# Patient Record
Sex: Female | Born: 1985 | Race: White | Hispanic: No | Marital: Single | Smoking: Former smoker
Health system: Southern US, Community
[De-identification: ages and names within clinical notes are randomized; demographics above are authoritative.]

## PROBLEM LIST (undated history)

## (undated) ENCOUNTER — Inpatient Hospital Stay (HOSPITAL_COMMUNITY): Payer: Self-pay

## (undated) DIAGNOSIS — F329 Major depressive disorder, single episode, unspecified: Secondary | ICD-10-CM

## (undated) DIAGNOSIS — F4310 Post-traumatic stress disorder, unspecified: Secondary | ICD-10-CM

## (undated) DIAGNOSIS — R87619 Unspecified abnormal cytological findings in specimens from cervix uteri: Secondary | ICD-10-CM

## (undated) DIAGNOSIS — F209 Schizophrenia, unspecified: Secondary | ICD-10-CM

## (undated) DIAGNOSIS — J45909 Unspecified asthma, uncomplicated: Secondary | ICD-10-CM

## (undated) DIAGNOSIS — R519 Headache, unspecified: Secondary | ICD-10-CM

## (undated) DIAGNOSIS — F909 Attention-deficit hyperactivity disorder, unspecified type: Secondary | ICD-10-CM

## (undated) DIAGNOSIS — IMO0002 Reserved for concepts with insufficient information to code with codable children: Secondary | ICD-10-CM

## (undated) DIAGNOSIS — F32A Depression, unspecified: Secondary | ICD-10-CM

## (undated) DIAGNOSIS — M26609 Unspecified temporomandibular joint disorder, unspecified side: Secondary | ICD-10-CM

## (undated) DIAGNOSIS — B009 Herpesviral infection, unspecified: Secondary | ICD-10-CM

## (undated) DIAGNOSIS — R51 Headache: Secondary | ICD-10-CM

## (undated) DIAGNOSIS — F319 Bipolar disorder, unspecified: Secondary | ICD-10-CM

## (undated) DIAGNOSIS — M1990 Unspecified osteoarthritis, unspecified site: Secondary | ICD-10-CM

## (undated) DIAGNOSIS — K219 Gastro-esophageal reflux disease without esophagitis: Secondary | ICD-10-CM

## (undated) HISTORY — PX: WISDOM TOOTH EXTRACTION: SHX21

## (undated) HISTORY — PX: COLPOSCOPY W/ BIOPSY / CURETTAGE: SUR283

---
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
## 2008-08-18 ENCOUNTER — Emergency Department: Payer: Self-pay | Admitting: Emergency Medicine

## 2010-12-23 ENCOUNTER — Emergency Department (HOSPITAL_COMMUNITY): Payer: Self-pay

## 2010-12-23 ENCOUNTER — Emergency Department (HOSPITAL_COMMUNITY)
Admission: EM | Admit: 2010-12-23 | Discharge: 2010-12-23 | Disposition: A | Discharge status: Home / Self Care | Payer: Self-pay | Attending: Emergency Medicine | Admitting: Emergency Medicine

## 2010-12-23 DIAGNOSIS — M25539 Pain in unspecified wrist: Secondary | ICD-10-CM | POA: Insufficient documentation

## 2010-12-23 DIAGNOSIS — S63509A Unspecified sprain of unspecified wrist, initial encounter: Secondary | ICD-10-CM | POA: Insufficient documentation

## 2011-09-10 ENCOUNTER — Encounter: Payer: Self-pay | Admitting: Physical Medicine and Rehabilitation

## 2011-09-10 ENCOUNTER — Emergency Department (HOSPITAL_COMMUNITY)
Admission: EM | Admit: 2011-09-10 | Discharge: 2011-09-10 | Disposition: A | Discharge status: Home / Self Care | Payer: Self-pay | Attending: Emergency Medicine | Admitting: Emergency Medicine

## 2011-09-10 DIAGNOSIS — R109 Unspecified abdominal pain: Secondary | ICD-10-CM | POA: Insufficient documentation

## 2011-09-10 DIAGNOSIS — Z3490 Encounter for supervision of normal pregnancy, unspecified, unspecified trimester: Secondary | ICD-10-CM

## 2011-09-10 DIAGNOSIS — O219 Vomiting of pregnancy, unspecified: Secondary | ICD-10-CM | POA: Insufficient documentation

## 2011-09-10 DIAGNOSIS — F17200 Nicotine dependence, unspecified, uncomplicated: Secondary | ICD-10-CM | POA: Insufficient documentation

## 2011-09-10 LAB — URINALYSIS, ROUTINE W REFLEX MICROSCOPIC
Bilirubin Urine: NEGATIVE
Glucose, UA: NEGATIVE mg/dL
Hgb urine dipstick: NEGATIVE
Ketones, ur: 15 mg/dL — AB
Leukocytes, UA: NEGATIVE
Protein, ur: NEGATIVE mg/dL
pH: 7.5 (ref 5.0–8.0)

## 2011-09-10 MED ORDER — ONDANSETRON 4 MG PO TBDP
4.0000 mg | ORAL_TABLET | Freq: Once | ORAL | Status: AC
  Administered 2011-09-10: 4 mg via ORAL
  Filled 2011-09-10: qty 1

## 2011-09-10 MED ORDER — ONDANSETRON 8 MG PO TBDP: 8.0000 mg | ORAL_TABLET | Freq: Three times a day (TID) | ORAL | Status: AC | PRN

## 2011-09-10 MED ORDER — PRENATAL RX 60-1 MG PO TABS: 1.0000 | ORAL_TABLET | Freq: Every day | ORAL | Status: DC

## 2011-09-10 NOTE — ED Provider Notes (Signed)
History     CSN: [id number] Arrival date & time: 09/10/2011  4:22 PM   First MD Initiated Contact with Patient 09/10/11 1923      Chief Complaint  Patient presents with  . Abdominal Pain    (Consider location/radiation/quality/duration/timing/severity/associated sxs/prior treatment) Patient is a 25 y.o. female presenting with abdominal pain. The history is provided by the patient.  Abdominal Pain The primary symptoms of the illness include abdominal pain, nausea and vomiting. The primary symptoms of the illness do not include shortness of breath, diarrhea, vaginal discharge or vaginal bleeding.  Symptoms associated with the illness do not include back pain.   patient has had some abdominal pain for last 3 weeks. She's also had nausea vomiting. Is worse lower abdomen. She states she may be urinating a little more frequently. No fevers. No diarrhea. She states her last period was the end of last month but was lighter than normal. He states it is possible she could be pregnant. No vaginal discharge. No lightheadedness or dizziness. No sick contacts.  No past medical history on file.  No past surgical history on file.  No family history on file.  History  Substance Use Topics  . Smoking status: Current Everyday Smoker  . Smokeless tobacco: Not on file  . Alcohol Use:     OB History    Grav Para Term Preterm Abortions TAB SAB Ect Mult Living                  Review of Systems  Constitutional: Negative for activity change and appetite change.  HENT: Negative for neck stiffness.   Eyes: Negative for pain.  Respiratory: Negative for chest tightness and shortness of breath.   Cardiovascular: Negative for chest pain and leg swelling.  Gastrointestinal: Positive for nausea, vomiting and abdominal pain. Negative for diarrhea.  Genitourinary: Negative for flank pain, vaginal bleeding and vaginal discharge.  Musculoskeletal: Negative for back pain.  Skin: Negative for rash.    Neurological: Negative for weakness, numbness and headaches.  Psychiatric/Behavioral: Negative for behavioral problems.    Allergies  Review of patient's allergies indicates no known allergies.  Home Medications   Current Outpatient Rx  Name Route Sig Dispense Refill  . VALACYCLOVIR HCL 500 MG PO TABS Oral Take 500 mg by mouth every morning.      Marland Kitchen ONDANSETRON 8 MG PO TBDP Oral Take 1 tablet (8 mg total) by mouth every 8 (eight) hours as needed for nausea. 20 tablet 0  . PRENATAL RX 60-1 MG PO TABS Oral Take 1 tablet by mouth daily. 30 tablet 1    BP 101/58  Pulse 81  Temp(Src) 97.8 F (36.6 C) (Oral)  Resp 16  SpO2 100%  LMP 08/26/2011  Physical Exam  Nursing note and vitals reviewed. Constitutional: She is oriented to person, place, and time. She appears well-developed and well-nourished.  HENT:  Head: Normocephalic and atraumatic.  Eyes: EOM are normal. Pupils are equal, round, and reactive to light.  Neck: Normal range of motion. Neck supple.  Cardiovascular: Normal rate, regular rhythm and normal heart sounds.   No murmur heard. Pulmonary/Chest: Effort normal and breath sounds normal. No respiratory distress. She has no wheezes. She has no rales.  Abdominal: Soft. Bowel sounds are normal. She exhibits no distension. There is no tenderness. There is no rebound and no guarding.  Musculoskeletal: Normal range of motion.  Neurological: She is alert and oriented to person, place, and time. No cranial nerve deficit.  Skin: Skin is  warm and dry.  Psychiatric: She has a normal mood and affect. Her speech is normal.    ED Course  Procedures (including critical care time)  Labs Reviewed  URINALYSIS, ROUTINE W REFLEX MICROSCOPIC - Abnormal; Notable for the following:    APPearance CLOUDY (*)    Ketones, ur 15 (*)    All other components within normal limits  POCT PREGNANCY, URINE  POCT PREGNANCY, URINE   No results found.   1. Pregnancy with uncertain dates   2.  Nausea and vomiting in pregnancy       MDM  Lower abdominal pain. Nausea vomiting without diarrhea. Benign exam. Patient has positive pregnancy test here. I doubt ectopic pregnancy at this time Although is not definitively ruled out. She'll followup with GYN.        Juliet Rude. Rubin Payor, MD 09/10/11 [phone]

## 2011-09-10 NOTE — ED Notes (Signed)
Pt voiced understanding to f/u with OBGYN and to return for any worsening of condition.

## 2011-09-10 NOTE — ED Notes (Signed)
Pt presents to department for evaluation of diffuse abdominal pain and N/V. Ongoing x3 weeks. Denies pain at the time. No urinary problems. No vaginal bleeding/discharge.  She is alert and oriented x4. Ambulatory to triage without difficulty.

## 2011-12-28 LAB — OB RESULTS CONSOLE ABO/RH: RH Type: POSITIVE

## 2011-12-28 LAB — OB RESULTS CONSOLE RPR: RPR: NONREACTIVE

## 2011-12-28 LAB — OB RESULTS CONSOLE HEPATITIS B SURFACE ANTIGEN: Hepatitis B Surface Ag: NEGATIVE

## 2012-03-01 ENCOUNTER — Other Ambulatory Visit: Payer: Self-pay | Admitting: Obstetrics

## 2012-04-29 ENCOUNTER — Inpatient Hospital Stay (HOSPITAL_COMMUNITY)
Admission: AD | Admit: 2012-04-29 | Discharge: 2012-04-29 | Disposition: A | Discharge status: Home / Self Care | Payer: Medicaid Other | Source: Ambulatory Visit | Attending: Obstetrics & Gynecology | Admitting: Obstetrics & Gynecology

## 2012-04-29 ENCOUNTER — Encounter (HOSPITAL_COMMUNITY): Payer: Self-pay | Admitting: *Deleted

## 2012-04-29 DIAGNOSIS — B373 Candidiasis of vulva and vagina: Secondary | ICD-10-CM

## 2012-04-29 DIAGNOSIS — Z331 Pregnant state, incidental: Secondary | ICD-10-CM

## 2012-04-29 DIAGNOSIS — B3731 Acute candidiasis of vulva and vagina: Secondary | ICD-10-CM | POA: Insufficient documentation

## 2012-04-29 DIAGNOSIS — O2390 Unspecified genitourinary tract infection in pregnancy, unspecified trimester: Secondary | ICD-10-CM | POA: Insufficient documentation

## 2012-04-29 DIAGNOSIS — N949 Unspecified condition associated with female genital organs and menstrual cycle: Secondary | ICD-10-CM | POA: Insufficient documentation

## 2012-04-29 DIAGNOSIS — R300 Dysuria: Secondary | ICD-10-CM | POA: Insufficient documentation

## 2012-04-29 HISTORY — DX: Unspecified abnormal cytological findings in specimens from cervix uteri: R87.619

## 2012-04-29 HISTORY — DX: Reserved for concepts with insufficient information to code with codable children: IMO0002

## 2012-04-29 HISTORY — DX: Unspecified asthma, uncomplicated: J45.909

## 2012-04-29 LAB — URINALYSIS, ROUTINE W REFLEX MICROSCOPIC
Bilirubin Urine: NEGATIVE
Glucose, UA: NEGATIVE mg/dL
Hgb urine dipstick: NEGATIVE
Specific Gravity, Urine: 1.01 (ref 1.005–1.030)
Urobilinogen, UA: 0.2 mg/dL (ref 0.0–1.0)
pH: 6 (ref 5.0–8.0)

## 2012-04-29 LAB — POCT FERN TEST: Fern Test: NEGATIVE

## 2012-04-29 LAB — WET PREP, GENITAL
Clue Cells Wet Prep HPF POC: NONE SEEN
Trich, Wet Prep: NONE SEEN
Yeast Wet Prep HPF POC: NONE SEEN

## 2012-04-29 MED ORDER — NYSTATIN-TRIAMCINOLONE 100000-0.1 UNIT/GM-% EX CREA: TOPICAL_CREAM | Freq: Four times a day (QID) | CUTANEOUS | Status: AC

## 2012-04-29 NOTE — MAU Note (Signed)
Onset of symptoms this morning vaginal swelling, redness and burning when urinating, 39 weeks G1

## 2012-04-29 NOTE — MAU Provider Note (Signed)
Chief Complaint:  Vaginal Pain and Dysuria  First Provider Initiated Contact with Patient 04/29/12 1757     HPI  Cheryl [name] is a 26 y.o. G1P0 at [redacted]w[redacted]d presenting with vaginal swelling and redness, watery and clumpy white discharge, and burning when urinating. Unsure if burning is internal or external. Denies hematuria, frequency, fever, chills or vaginal bleeding. Good fetal movement.   Past Medical History: Past Medical History  Diagnosis Date  . Asthma     as a child  . Abnormal Pap smear     Past Surgical History: Past Surgical History  Procedure Date  . Colposcopy w/ biopsy / curettage     Family History: No family history on file.  Social History: History  Substance Use Topics  . Smoking status: Former Smoker    Quit date: 10/30/2010  . Smokeless tobacco: Not on file  . Alcohol Use: No    Allergies: No Known Allergies  Meds:  Prescriptions prior to admission  Medication Sig Dispense Refill  . buPROPion (WELLBUTRIN) 100 MG tablet Take 100 mg by mouth 2 (two) times daily.      Marland Kitchen omeprazole (PRILOSEC) 20 MG capsule Take 20 mg by mouth daily.      . ondansetron (ZOFRAN-ODT) 8 MG disintegrating tablet Take 8 mg by mouth every 8 (eight) hours as needed. nausea      . Prenatal Vit-Fe Fumarate-FA (PRENATAL MULTIVITAMIN) TABS Take 1 tablet by mouth every morning.      . valACYclovir (VALTREX) 500 MG tablet Take 500 mg by mouth every morning.            Physical Exam  Blood pressure 120/76, pulse 92, temperature 98.5 F (36.9 C), temperature source Oral, last menstrual period 08/26/2011. GENERAL: Well-developed, well-nourished female in no acute distress.  HEENT: normocephalic, good dentition HEART: normal rate RESP: normal effort ABDOMEN: Soft, nontender, gravid appropriate for gestational age EXTREMITIES: Nontender, no edema NEURO: alert and oriented SPECULUM EXAM: Moderate amount of white, curd-like, odorless discharge mixed with white fluid  Dilation:  1.5 Effacement (%): 70 Cervical Position: Middle Station: -2 Presentation: Vertex Exam by:: V.Brizza Nathanson,CNM  FHT:  Baseline 140 , moderate variability, accelerations present, no decelerations Contractions: regular, mild   Labs: Recent Results (from the past 168 hour(s))  URINALYSIS, ROUTINE W REFLEX MICROSCOPIC   Collection Time   04/29/12  4:00 PM      Component Value Range   Color, Urine YELLOW  YELLOW   APPearance CLEAR  CLEAR   Specific Gravity, Urine 1.010  1.005 - 1.030   pH 6.0  5.0 - 8.0   Glucose, UA NEGATIVE  NEGATIVE mg/dL   Hgb urine dipstick NEGATIVE  NEGATIVE   Bilirubin Urine NEGATIVE  NEGATIVE   Ketones, ur NEGATIVE  NEGATIVE mg/dL   Protein, ur NEGATIVE  NEGATIVE mg/dL   Urobilinogen, UA 0.2  0.0 - 1.0 mg/dL   Nitrite NEGATIVE  NEGATIVE   Leukocytes, UA NEGATIVE  NEGATIVE  WET PREP, GENITAL   Collection Time   04/29/12  5:50 PM      Component Value Range   Yeast Wet Prep HPF POC NONE SEEN  NONE SEEN   Trich, Wet Prep NONE SEEN  NONE SEEN   Clue Cells Wet Prep HPF POC NONE SEEN  NONE SEEN   WBC, Wet Prep HPF POC FEW (*) NONE SEEN  POCT FERN TEST   Collection Time   04/29/12  6:18 PM      Component Value Range   Fern Test Negative  Collection Time                                                  Imaging:  NA  Assessment: 1. Vulvovaginal candidiasis   2. Pregnancy, incidental   3. Dysuria    Plan: D/C home Urine culture pending Medication List  As of 05/01/2012 12:06 AM   START taking these medications         nystatin-triamcinolone cream   Commonly known as: MYCOLOG II   Apply topically 4 (four) times daily.         CONTINUE taking these medications         buPROPion 100 MG tablet   Commonly known as: WELLBUTRIN      omeprazole 20 MG capsule   Commonly known as: PRILOSEC      ondansetron 8 MG disintegrating tablet   Commonly known as: ZOFRAN-ODT      prenatal multivitamin Tabs      valACYclovir 500 MG tablet   Commonly  known as: VALTREX          Where to get your medications    These are the prescriptions that you need to pick up.   You may get these medications from any pharmacy.         nystatin-triamcinolone cream           Follow-up Information    Follow up with Roseanna Rainbow, MD. (as scheduled)    Contact information:   [address] [place] [address] [phone]         Dorathy Kinsman 7/27/20136:06 PM

## 2012-04-30 ENCOUNTER — Encounter (HOSPITAL_COMMUNITY): Payer: Self-pay | Admitting: *Deleted

## 2012-04-30 ENCOUNTER — Encounter (HOSPITAL_COMMUNITY): Payer: Self-pay | Admitting: Anesthesiology

## 2012-04-30 ENCOUNTER — Inpatient Hospital Stay (HOSPITAL_COMMUNITY)
Admission: AD | Admit: 2012-04-30 | Discharge: 2012-05-03 | DRG: 766 | Disposition: A | Discharge status: Home / Self Care | Payer: Medicaid Other | Source: Ambulatory Visit | Attending: Obstetrics & Gynecology | Admitting: Obstetrics & Gynecology

## 2012-04-30 ENCOUNTER — Encounter (HOSPITAL_COMMUNITY)
Admission: AD | Disposition: A | Discharge status: Home / Self Care | Payer: Self-pay | Source: Ambulatory Visit | Attending: Obstetrics & Gynecology

## 2012-04-30 ENCOUNTER — Inpatient Hospital Stay (HOSPITAL_COMMUNITY)
Admission: AD | Admit: 2012-04-30 | Discharge: 2012-04-30 | Disposition: A | Discharge status: Home / Self Care | Payer: Medicaid Other | Source: Ambulatory Visit | Attending: Obstetrics & Gynecology | Admitting: Obstetrics & Gynecology

## 2012-04-30 ENCOUNTER — Inpatient Hospital Stay (HOSPITAL_COMMUNITY): Payer: Medicaid Other | Admitting: Anesthesiology

## 2012-04-30 ENCOUNTER — Encounter (HOSPITAL_COMMUNITY): Payer: Self-pay

## 2012-04-30 DIAGNOSIS — Z98891 History of uterine scar from previous surgery: Secondary | ICD-10-CM

## 2012-04-30 DIAGNOSIS — A6000 Herpesviral infection of urogenital system, unspecified: Secondary | ICD-10-CM | POA: Diagnosis present

## 2012-04-30 DIAGNOSIS — O479 False labor, unspecified: Secondary | ICD-10-CM | POA: Insufficient documentation

## 2012-04-30 DIAGNOSIS — O98519 Other viral diseases complicating pregnancy, unspecified trimester: Principal | ICD-10-CM | POA: Diagnosis present

## 2012-04-30 HISTORY — DX: Major depressive disorder, single episode, unspecified: F32.9

## 2012-04-30 HISTORY — DX: Depression, unspecified: F32.A

## 2012-04-30 LAB — CBC
HCT: 35.2 % — ABNORMAL LOW (ref 36.0–46.0)
Hemoglobin: 11.5 g/dL — ABNORMAL LOW (ref 12.0–15.0)
RBC: 4.13 MIL/uL (ref 3.87–5.11)
WBC: 15.7 10*3/uL — ABNORMAL HIGH (ref 4.0–10.5)

## 2012-04-30 SURGERY — Surgical Case
Anesthesia: Spinal | Site: Abdomen | Wound class: Clean Contaminated

## 2012-04-30 MED ORDER — HYDROMORPHONE HCL PF 1 MG/ML IJ SOLN: 0.2500 mg | INTRAMUSCULAR | Status: DC | PRN

## 2012-04-30 MED ORDER — OXYTOCIN 10 UNIT/ML IJ SOLN
40.0000 [IU] | INTRAVENOUS | Status: DC | PRN
  Administered 2012-04-30: 40 [IU] via INTRAVENOUS

## 2012-04-30 MED ORDER — FENTANYL CITRATE 0.05 MG/ML IJ SOLN
INTRAMUSCULAR | Status: AC
  Filled 2012-04-30: qty 2

## 2012-04-30 MED ORDER — SCOPOLAMINE 1 MG/3DAYS TD PT72
1.0000 | MEDICATED_PATCH | Freq: Once | TRANSDERMAL | Status: DC
  Administered 2012-04-30: 1.5 mg via TRANSDERMAL

## 2012-04-30 MED ORDER — MORPHINE SULFATE 0.5 MG/ML IJ SOLN
INTRAMUSCULAR | Status: AC
  Filled 2012-04-30: qty 10

## 2012-04-30 MED ORDER — PHENYLEPHRINE 40 MCG/ML (10ML) SYRINGE FOR IV PUSH (FOR BLOOD PRESSURE SUPPORT)
PREFILLED_SYRINGE | INTRAVENOUS | Status: AC
  Filled 2012-04-30: qty 5

## 2012-04-30 MED ORDER — ONDANSETRON HCL 4 MG/2ML IJ SOLN
INTRAMUSCULAR | Status: DC | PRN
  Administered 2012-04-30: 4 mg via INTRAVENOUS

## 2012-04-30 MED ORDER — MORPHINE SULFATE (PF) 0.5 MG/ML IJ SOLN
INTRAMUSCULAR | Status: DC | PRN
  Administered 2012-04-30: .9 mg via INTRAVENOUS
  Administered 2012-04-30 (×2): 1 mg via INTRAVENOUS
  Administered 2012-04-30: 2 mg via INTRAVENOUS

## 2012-04-30 MED ORDER — KETOROLAC TROMETHAMINE 60 MG/2ML IM SOLN
60.0000 mg | Freq: Once | INTRAMUSCULAR | Status: AC | PRN
  Administered 2012-04-30: 60 mg via INTRAMUSCULAR
  Filled 2012-04-30: qty 2

## 2012-04-30 MED ORDER — MORPHINE SULFATE (PF) 0.5 MG/ML IJ SOLN
INTRAMUSCULAR | Status: DC | PRN
  Administered 2012-04-30: .1 mg via INTRATHECAL

## 2012-04-30 MED ORDER — DEXTROSE IN LACTATED RINGERS 5 % IV SOLN
INTRAVENOUS | Status: DC
  Administered 2012-04-30: 21:00:00 via INTRAVENOUS

## 2012-04-30 MED ORDER — OXYTOCIN 10 UNIT/ML IJ SOLN
INTRAMUSCULAR | Status: AC
  Filled 2012-04-30: qty 4

## 2012-04-30 MED ORDER — CITRIC ACID-SODIUM CITRATE 334-500 MG/5ML PO SOLN
ORAL | Status: AC
  Administered 2012-04-30: 30 mL via ORAL
  Filled 2012-04-30: qty 15

## 2012-04-30 MED ORDER — MEPERIDINE HCL 25 MG/ML IJ SOLN
INTRAMUSCULAR | Status: DC | PRN
  Administered 2012-04-30: 25 mg via INTRAVENOUS

## 2012-04-30 MED ORDER — MEPERIDINE HCL 25 MG/ML IJ SOLN: 6.2500 mg | INTRAMUSCULAR | Status: DC | PRN

## 2012-04-30 MED ORDER — SCOPOLAMINE 1 MG/3DAYS TD PT72
MEDICATED_PATCH | TRANSDERMAL | Status: AC
  Administered 2012-04-30: 1.5 mg via TRANSDERMAL
  Filled 2012-04-30: qty 1

## 2012-04-30 MED ORDER — DEXTROSE 5 % IV SOLN
3.0000 g | Freq: Once | INTRAVENOUS | Status: AC
  Administered 2012-04-30: 3 g via INTRAVENOUS
  Filled 2012-04-30: qty 3000

## 2012-04-30 MED ORDER — PHENYLEPHRINE HCL 10 MG/ML IJ SOLN
INTRAMUSCULAR | Status: DC | PRN
  Administered 2012-04-30: 80 ug via INTRAVENOUS
  Administered 2012-04-30: 40 ug via INTRAVENOUS
  Administered 2012-04-30: 120 ug via INTRAVENOUS
  Administered 2012-04-30 (×3): 80 ug via INTRAVENOUS
  Administered 2012-04-30: 120 ug via INTRAVENOUS

## 2012-04-30 MED ORDER — LACTATED RINGERS IV SOLN
INTRAVENOUS | Status: DC | PRN
  Administered 2012-04-30 (×2): via INTRAVENOUS

## 2012-04-30 MED ORDER — FENTANYL CITRATE 0.05 MG/ML IJ SOLN
INTRAMUSCULAR | Status: DC | PRN
  Administered 2012-04-30: 15 ug via INTRATHECAL
  Administered 2012-04-30: 85 ug via INTRAVENOUS

## 2012-04-30 MED ORDER — KETOROLAC TROMETHAMINE 60 MG/2ML IM SOLN
INTRAMUSCULAR | Status: AC
  Filled 2012-04-30: qty 2

## 2012-04-30 MED ORDER — PHENYLEPHRINE 40 MCG/ML (10ML) SYRINGE FOR IV PUSH (FOR BLOOD PRESSURE SUPPORT)
PREFILLED_SYRINGE | INTRAVENOUS | Status: AC
  Filled 2012-04-30: qty 10

## 2012-04-30 MED ORDER — ONDANSETRON HCL 4 MG/2ML IJ SOLN
INTRAMUSCULAR | Status: AC
  Filled 2012-04-30: qty 2

## 2012-04-30 MED ORDER — CITRIC ACID-SODIUM CITRATE 334-500 MG/5ML PO SOLN
30.0000 mL | Freq: Once | ORAL | Status: AC
  Administered 2012-04-30: 30 mL via ORAL

## 2012-04-30 MED ORDER — 0.9 % SODIUM CHLORIDE (POUR BTL) OPTIME
TOPICAL | Status: DC | PRN
  Administered 2012-04-30: 300 mL
  Administered 2012-04-30: 500 mL

## 2012-04-30 MED ORDER — EPHEDRINE 5 MG/ML INJ
INTRAVENOUS | Status: AC
  Filled 2012-04-30: qty 10

## 2012-04-30 MED ORDER — MEPERIDINE HCL 25 MG/ML IJ SOLN
INTRAMUSCULAR | Status: AC
  Filled 2012-04-30: qty 1

## 2012-04-30 MED ORDER — EPHEDRINE SULFATE 50 MG/ML IJ SOLN
INTRAMUSCULAR | Status: DC | PRN
  Administered 2012-04-30 (×4): 10 mg via INTRAVENOUS

## 2012-04-30 SURGICAL SUPPLY — 41 items
APL SKNCLS STERI-STRIP NONHPOA (GAUZE/BANDAGES/DRESSINGS) ×1
BENZOIN TINCTURE PRP APPL 2/3 (GAUZE/BANDAGES/DRESSINGS) ×2 IMPLANT
CANISTER WOUND CARE 500ML ATS (WOUND CARE) IMPLANT
CHLORAPREP W/TINT 26ML (MISCELLANEOUS) ×2 IMPLANT
CLOTH BEACON ORANGE TIMEOUT ST (SAFETY) ×2 IMPLANT
CONTAINER PREFILL 10% NBF 15ML (MISCELLANEOUS) IMPLANT
DRESSING TELFA 8X3 (GAUZE/BANDAGES/DRESSINGS) ×2 IMPLANT
DRSG VAC ATS LRG SENSATRAC (GAUZE/BANDAGES/DRESSINGS) IMPLANT
DRSG VAC ATS MED SENSATRAC (GAUZE/BANDAGES/DRESSINGS) IMPLANT
DRSG VAC ATS SM SENSATRAC (GAUZE/BANDAGES/DRESSINGS) IMPLANT
ELECT REM PT RETURN 9FT ADLT (ELECTROSURGICAL) ×2
ELECTRODE REM PT RTRN 9FT ADLT (ELECTROSURGICAL) ×1 IMPLANT
EXTRACTOR VACUUM M CUP 4 TUBE (SUCTIONS) IMPLANT
GAUZE SPONGE 4X4 12PLY STRL LF (GAUZE/BANDAGES/DRESSINGS) ×4 IMPLANT
GLOVE BIO SURGEON STRL SZ 6.5 (GLOVE) ×4 IMPLANT
GOWN PREVENTION PLUS LG XLONG (DISPOSABLE) ×6 IMPLANT
KIT ABG SYR 3ML LUER SLIP (SYRINGE) ×1 IMPLANT
NDL HYPO 25X5/8 SAFETYGLIDE (NEEDLE) ×1 IMPLANT
NEEDLE HYPO 25X5/8 SAFETYGLIDE (NEEDLE) ×2 IMPLANT
NS IRRIG 1000ML POUR BTL (IV SOLUTION) ×2 IMPLANT
PACK C SECTION WH (CUSTOM PROCEDURE TRAY) ×2 IMPLANT
PAD ABD 7.5X8 STRL (GAUZE/BANDAGES/DRESSINGS) ×2 IMPLANT
RTRCTR C-SECT PINK 25CM LRG (MISCELLANEOUS) IMPLANT
RTRCTR C-SECT PINK 34CM XLRG (MISCELLANEOUS) IMPLANT
SLEEVE SCD COMPRESS KNEE MED (MISCELLANEOUS) IMPLANT
STAPLER VISISTAT 35W (STAPLE) IMPLANT
STRIP CLOSURE SKIN 1/2X4 (GAUZE/BANDAGES/DRESSINGS) ×2 IMPLANT
SUT MNCRL 0 VIOLET CTX 36 (SUTURE) ×2 IMPLANT
SUT MNCRL AB 3-0 PS2 27 (SUTURE) ×1 IMPLANT
SUT MONOCRYL 0 CTX 36 (SUTURE) ×2
SUT PDS AB 0 CTX 36 PDP370T (SUTURE) ×1 IMPLANT
SUT PLAIN 0 NONE (SUTURE) IMPLANT
SUT VIC AB 0 CTXB 36 (SUTURE) ×1 IMPLANT
SUT VIC AB 2-0 CT1 (SUTURE) IMPLANT
SUT VIC AB 2-0 CT1 27 (SUTURE) ×2
SUT VIC AB 2-0 CT1 TAPERPNT 27 (SUTURE) ×1 IMPLANT
SUT VIC AB 2-0 SH 27 (SUTURE)
SUT VIC AB 2-0 SH 27XBRD (SUTURE) IMPLANT
TOWEL OR 17X24 6PK STRL BLUE (TOWEL DISPOSABLE) ×4 IMPLANT
TRAY FOLEY CATH 14FR (SET/KITS/TRAYS/PACK) ×2 IMPLANT
WATER STERILE IRR 1000ML POUR (IV SOLUTION) ×2 IMPLANT

## 2012-04-30 NOTE — Anesthesia Procedure Notes (Signed)

## 2012-04-30 NOTE — MAU Note (Signed)
Pt here for second time today. For ctx. 2.5 cm dilated last time. More uncomfortable and nauseate and vomiting.

## 2012-04-30 NOTE — MAU Note (Signed)
Contractions worse since 1am this morning. Denies vaginal bleeding or leaking of fluid. States can't tell the difference between fetal movement and contractions. Doesn't know how frequent contractions or how many she has per hour.

## 2012-04-30 NOTE — Transfer of Care (Signed)
Immediate Anesthesia Transfer of Care Note  Patient: Cheryl [name]  Procedure(s) Performed: Procedure(s) (LRB): CESAREAN SECTION (N/A)  Patient Location: PACU  Anesthesia Type: Spinal  Level of Consciousness: awake, alert  and oriented  Airway & Oxygen Therapy: Patient Spontanous Breathing  Post-op Assessment: Report given to PACU RN and Post -op Vital signs reviewed and stable  Post vital signs: stable  Complications: No apparent anesthesia complications

## 2012-04-30 NOTE — H&P (Signed)
Cheryl [name] is a 26 y.o. female presenting for contractions. Maternal Medical History:  Reason for admission: Reason for admission: contractions.  Contractions: Onset was yesterday.   Frequency: irregular.    Fetal activity: Perceived fetal activity is normal.    Prenatal complications: Genital HSV    OB History    Grav Para Term Preterm Abortions TAB SAB Ect Mult Living   1              Past Medical History  Diagnosis Date  . Asthma     as a child  . Abnormal Pap smear   . Depression    Past Surgical History  Procedure Date  . Colposcopy w/ biopsy / curettage    Family History: family history is negative for Other. Social History:  reports that she quit smoking about 18 months ago. She does not have any smokeless tobacco history on file. She reports that she does not drink alcohol or use illicit drugs.     Review of Systems  Constitutional: Negative for fever.  Eyes: Negative for blurred vision.  Respiratory: Negative for shortness of breath.   Gastrointestinal: Negative for vomiting.  Skin: Negative for rash.  Neurological: Negative for headaches.    Dilation: 4 Effacement (%): 100 Station: -2 Exam by:: B Mosca Blood pressure 111/80, pulse 96, temperature 98.5 F (36.9 C), temperature source Oral, resp. rate 18, height 5\' 4"  (1.626 m), weight 86.637 kg (191 lb), last menstrual period 08/26/2011. Maternal Exam:  Abdomen: Patient reports no abdominal tenderness. Introitus: Erythema per CNM  Cervix: Cervix evaluated by digital exam.     Fetal Exam Fetal Monitor Review: Variability: moderate (6-25 bpm).   Pattern: accelerations present and no decelerations.    Fetal State Assessment: Category I - tracings are normal.     Physical Exam  Constitutional: She appears well-developed.  HENT:  Head: Normocephalic.  Neck: Neck supple. No thyromegaly present.  Cardiovascular: Normal rate and regular rhythm.   Respiratory: Breath sounds normal.  GI: Soft.  Bowel sounds are normal.  Skin: No rash noted.    Prenatal labs: ABO, Rh: O/Positive/-- (03/26 0000) Antibody: Negative (03/26 0000) Rubella: Equivocal (03/26 0000) RPR: Nonreactive (03/26 0000)  HBsAg: Negative (03/26 0000)  HIV: Non-reactive (03/26 0000)  GBS: Negative (07/02 0000)   Assessment/Plan: [place] @ [redacted]w[redacted]d.  Recent, recurrent genital HSV outbreak 4 days ago.  Latent labor,  Admit C/D   JACKSON-MOORE,Luretha Eberly A 04/30/2012, 8:12 PM

## 2012-04-30 NOTE — Op Note (Signed)
Cesarean Section Procedure Note   ADALEA HANDLER   04/30/2012  Indications: Recent, recurrent genital herpes outbreak; latent labor   Pre-operative Diagnosis: Recent, recurrent genital herpes outbreak, latent labor  Post-operative Diagnosis: Same   Surgeon: Antionette Char A  Assistants: none  Anesthesia: spinal  Procedure Details:  The patient was seen in the Maternity Admissions Unit. The risks, benefits, complications, treatment options, and expected outcomes were discussed with the patient. The patient concurred with the proposed plan, giving informed consent. The patient was identified as Cheryl [name] and the procedure verified as C-Section Delivery. A Time Out was held and the above information confirmed.  After induction of anesthesia, the patient was draped and prepped in the usual sterile manner. A transverse incision was made and carried down through the subcutaneous tissue to the fascia. The fascial incision was made and extended transversely. The fascia was separated from the underlying rectus tissue superiorly and inferiorly. The peritoneum was identified and entered. The peritoneal incision was extended longitudinally. The utero-vesical peritoneal reflection was incised transversely and the bladder flap was bluntly freed from the lower uterine segment. A low transverse uterine incision was made. There was meconium-stained fluid at the time of amniotomy.  Delivered from cephalic presentation, direct occiput posterior was a living newborn female infant with Apgar scores of 9 at one minute and 9 at five minutes. A cord ph was sent. The pH was 7.29.  The umbilical cord was clamped and cut cord. A sample was obtained for evaluation. The placenta was removed Intact and appeared normal.  The uterine incision had extended laterally on the left-side.  To secure adequate hemostasis on this side an O'Leary suture was placed.  The uterine incision was closed with running locked sutures of  1-0 Monocryl. A second imbricating layer of the same suture was placed.  Hemostasis was observed. The paracolic gutters were irrigated. The fascia was then reapproximated with running sutures of 1-0Vicryl. The subcuticular closure was performed using 3-0 Monocryl.  Instrument, sponge, and needle counts were correct prior the abdominal closure and were correct at the conclusion of the case.    Findings:  See above   Estimated Blood Loss: 700 ml  Total IV Fluids: 1300 ml   Urine Output: 200CC OF clear urine  Specimens:  Placenta  Complications: no complications  Disposition: PACU - hemodynamically stable.  Maternal Condition: stable   Baby condition / location:  nursery-stable    Signed: Surgeon(s): Antionette Char, MD

## 2012-04-30 NOTE — MAU Note (Signed)
Pt reports uc's q 5 minutes for the last couple of hours.

## 2012-04-30 NOTE — Anesthesia Postprocedure Evaluation (Signed)
  Anesthesia Post-op Note  Patient: Cheryl [name]  Procedure(s) Performed: Procedure(s) (LRB): CESAREAN SECTION (N/A)  Patient is awake, responsive, moving her legs, and has signs of resolution of her numbness. Pain and nausea are reasonably well controlled. Vital signs are stable and clinically acceptable. Oxygen saturation is clinically acceptable. There are no apparent anesthetic complications at this time. Patient is ready for discharge.

## 2012-04-30 NOTE — Anesthesia Preprocedure Evaluation (Addendum)
Anesthesia Evaluation  Patient identified by MRN, date of birth, ID band Patient awake    Reviewed: Allergy & Precautions, H&P , Patient's Chart, lab work & pertinent test results  Airway Mallampati: II TM Distance: >3 FB Neck ROM: full    Dental No notable dental hx.    Pulmonary  breath sounds clear to auscultation  Pulmonary exam normal       Cardiovascular Exercise Tolerance: Good Rhythm:regular Rate:Normal     Neuro/Psych    GI/Hepatic   Endo/Other    Renal/GU      Musculoskeletal   Abdominal   Peds  Hematology   Anesthesia Other Findings   Reproductive/Obstetrics                           Anesthesia Physical Anesthesia Plan  ASA: II and Emergent  Anesthesia Plan: Spinal   Post-op Pain Management:    Induction:   Airway Management Planned:   Additional Equipment:   Intra-op Plan:   Post-operative Plan:   Informed Consent: I have reviewed the patients History and Physical, chart, labs and discussed the procedure including the risks, benefits and alternatives for the proposed anesthesia with the patient or authorized representative who has indicated his/her understanding and acceptance.   Dental Advisory Given  Plan Discussed with: CRNA  Anesthesia Plan Comments: (Lab work confirmed with CRNA in room. Platelets okay. Discussed spinal anesthetic, and patient consents to the procedure:  included risk of possible headache,backache, failed block, allergic reaction, and nerve injury. This patient was asked if she had any questions or concerns before the procedure started. )        Anesthesia Quick Evaluation

## 2012-05-01 ENCOUNTER — Encounter (HOSPITAL_COMMUNITY): Payer: Self-pay | Admitting: Obstetrics & Gynecology

## 2012-05-01 LAB — RPR: RPR Ser Ql: NONREACTIVE

## 2012-05-01 MED ORDER — MAGNESIUM HYDROXIDE 400 MG/5ML PO SUSP: 30.0000 mL | ORAL | Status: DC | PRN

## 2012-05-01 MED ORDER — NALOXONE HCL 0.4 MG/ML IJ SOLN: 0.4000 mg | INTRAMUSCULAR | Status: DC | PRN

## 2012-05-01 MED ORDER — SENNOSIDES-DOCUSATE SODIUM 8.6-50 MG PO TABS
2.0000 | ORAL_TABLET | Freq: Every day | ORAL | Status: DC
  Administered 2012-05-01 – 2012-05-02 (×2): 2 via ORAL

## 2012-05-01 MED ORDER — MEASLES, MUMPS & RUBELLA VAC ~~LOC~~ INJ
0.5000 mL | INJECTION | Freq: Once | SUBCUTANEOUS | Status: AC
  Administered 2012-05-01: 0.5 mL via SUBCUTANEOUS
  Filled 2012-05-01 (×2): qty 0.5

## 2012-05-01 MED ORDER — DIPHENHYDRAMINE HCL 50 MG/ML IJ SOLN: 25.0000 mg | INTRAMUSCULAR | Status: DC | PRN

## 2012-05-01 MED ORDER — ONDANSETRON HCL 4 MG PO TABS: 4.0000 mg | ORAL_TABLET | ORAL | Status: DC | PRN

## 2012-05-01 MED ORDER — VALACYCLOVIR HCL 500 MG PO TABS
1000.0000 mg | ORAL_TABLET | Freq: Two times a day (BID) | ORAL | Status: DC
  Administered 2012-05-01 – 2012-05-03 (×4): 1000 mg via ORAL
  Filled 2012-05-01 (×5): qty 2

## 2012-05-01 MED ORDER — ONDANSETRON HCL 4 MG/2ML IJ SOLN: 4.0000 mg | INTRAMUSCULAR | Status: DC | PRN

## 2012-05-01 MED ORDER — METOCLOPRAMIDE HCL 5 MG/ML IJ SOLN: 10.0000 mg | Freq: Three times a day (TID) | INTRAMUSCULAR | Status: DC | PRN

## 2012-05-01 MED ORDER — OXYCODONE-ACETAMINOPHEN 5-325 MG PO TABS
1.0000 | ORAL_TABLET | ORAL | Status: DC | PRN
  Administered 2012-05-01 – 2012-05-02 (×5): 2 via ORAL
  Administered 2012-05-02: 1 via ORAL
  Administered 2012-05-02 (×2): 2 via ORAL
  Administered 2012-05-02: 1 via ORAL
  Filled 2012-05-01: qty 2
  Filled 2012-05-01: qty 1
  Filled 2012-05-01 (×6): qty 2
  Filled 2012-05-01: qty 1

## 2012-05-01 MED ORDER — DIPHENHYDRAMINE HCL 50 MG/ML IJ SOLN: 12.5000 mg | INTRAMUSCULAR | Status: DC | PRN

## 2012-05-01 MED ORDER — BUPROPION HCL 100 MG PO TABS
100.0000 mg | ORAL_TABLET | Freq: Two times a day (BID) | ORAL | Status: DC
  Administered 2012-05-01 – 2012-05-03 (×4): 100 mg via ORAL
  Filled 2012-05-01 (×5): qty 1

## 2012-05-01 MED ORDER — KETOROLAC TROMETHAMINE 30 MG/ML IJ SOLN: 30.0000 mg | Freq: Four times a day (QID) | INTRAMUSCULAR | Status: AC | PRN

## 2012-05-01 MED ORDER — PRENATAL MULTIVITAMIN CH
1.0000 | ORAL_TABLET | Freq: Every day | ORAL | Status: DC
  Administered 2012-05-01 – 2012-05-03 (×3): 1 via ORAL
  Filled 2012-05-01 (×3): qty 1

## 2012-05-01 MED ORDER — IBUPROFEN 600 MG PO TABS
600.0000 mg | ORAL_TABLET | Freq: Four times a day (QID) | ORAL | Status: DC
  Administered 2012-05-01 – 2012-05-03 (×9): 600 mg via ORAL
  Filled 2012-05-01 (×4): qty 1

## 2012-05-01 MED ORDER — LACTATED RINGERS IV SOLN
INTRAVENOUS | Status: DC
  Administered 2012-05-01 (×2): via INTRAVENOUS

## 2012-05-01 MED ORDER — WITCH HAZEL-GLYCERIN EX PADS: 1.0000 "application " | MEDICATED_PAD | CUTANEOUS | Status: DC | PRN

## 2012-05-01 MED ORDER — SODIUM CHLORIDE 0.9 % IV SOLN
1.0000 ug/kg/h | INTRAVENOUS | Status: DC | PRN
  Filled 2012-05-01: qty 2.5

## 2012-05-01 MED ORDER — DIPHENHYDRAMINE HCL 25 MG PO CAPS: 25.0000 mg | ORAL_CAPSULE | Freq: Four times a day (QID) | ORAL | Status: DC | PRN

## 2012-05-01 MED ORDER — IBUPROFEN 600 MG PO TABS
600.0000 mg | ORAL_TABLET | Freq: Four times a day (QID) | ORAL | Status: DC | PRN
  Filled 2012-05-01 (×6): qty 1

## 2012-05-01 MED ORDER — NALBUPHINE HCL 10 MG/ML IJ SOLN
5.0000 mg | INTRAMUSCULAR | Status: DC | PRN
  Administered 2012-05-01: 5 mg via SUBCUTANEOUS
  Administered 2012-05-01: 10 mg via SUBCUTANEOUS
  Filled 2012-05-01: qty 1

## 2012-05-01 MED ORDER — SODIUM CHLORIDE 0.9 % IJ SOLN: 3.0000 mL | INTRAMUSCULAR | Status: DC | PRN

## 2012-05-01 MED ORDER — LANOLIN HYDROUS EX OINT: 1.0000 "application " | TOPICAL_OINTMENT | CUTANEOUS | Status: DC | PRN

## 2012-05-01 MED ORDER — ONDANSETRON HCL 4 MG/2ML IJ SOLN: 4.0000 mg | Freq: Three times a day (TID) | INTRAMUSCULAR | Status: DC | PRN

## 2012-05-01 MED ORDER — ZOLPIDEM TARTRATE 5 MG PO TABS: 5.0000 mg | ORAL_TABLET | Freq: Every evening | ORAL | Status: DC | PRN

## 2012-05-01 MED ORDER — MEDROXYPROGESTERONE ACETATE 150 MG/ML IM SUSP: 150.0000 mg | INTRAMUSCULAR | Status: DC | PRN

## 2012-05-01 MED ORDER — TETANUS-DIPHTH-ACELL PERTUSSIS 5-2.5-18.5 LF-MCG/0.5 IM SUSP
0.5000 mL | Freq: Once | INTRAMUSCULAR | Status: AC
  Administered 2012-05-01: 0.5 mL via INTRAMUSCULAR

## 2012-05-01 MED ORDER — SIMETHICONE 80 MG PO CHEW
80.0000 mg | CHEWABLE_TABLET | ORAL | Status: DC | PRN
  Administered 2012-05-01 – 2012-05-03 (×5): 80 mg via ORAL

## 2012-05-01 MED ORDER — NALBUPHINE HCL 10 MG/ML IJ SOLN
5.0000 mg | INTRAMUSCULAR | Status: DC | PRN
  Filled 2012-05-01: qty 1

## 2012-05-01 MED ORDER — FERROUS SULFATE 325 (65 FE) MG PO TABS
325.0000 mg | ORAL_TABLET | Freq: Two times a day (BID) | ORAL | Status: DC
  Administered 2012-05-01 – 2012-05-03 (×4): 325 mg via ORAL
  Filled 2012-05-01 (×4): qty 1

## 2012-05-01 MED ORDER — DIBUCAINE 1 % RE OINT: 1.0000 "application " | TOPICAL_OINTMENT | RECTAL | Status: DC | PRN

## 2012-05-01 MED ORDER — OXYTOCIN 40 UNITS IN LACTATED RINGERS INFUSION - SIMPLE MED: 62.5000 mL/h | INTRAVENOUS | Status: AC

## 2012-05-01 MED ORDER — DIPHENHYDRAMINE HCL 25 MG PO CAPS: 25.0000 mg | ORAL_CAPSULE | ORAL | Status: DC | PRN

## 2012-05-01 NOTE — Anesthesia Postprocedure Evaluation (Signed)
  Anesthesia Post-op Note  Patient: Cheryl [name]  Procedure(s) Performed: Procedure(s) (LRB): CESAREAN SECTION (N/A)  Patient Location: PACU and Mother/Baby  Anesthesia Type: Spinal  Level of Consciousness: awake, alert  and oriented  Airway and Oxygen Therapy: Patient Spontanous Breathing  Post-op Pain: none  Post-op Assessment: Post-op Vital signs reviewed  Post-op Vital Signs: Reviewed and stable  Complications: No apparent anesthesia complications

## 2012-05-01 NOTE — Addendum Note (Signed)
Addendum  created 05/01/12 0809 by Shanon Payor, CRNA   Modules edited:Notes Section

## 2012-05-01 NOTE — Progress Notes (Signed)
UR chart review completed.  

## 2012-05-01 NOTE — Addendum Note (Signed)
Addendum  created 05/01/12 1314 by Randa Spike, CRNA   Modules edited:Charges VN

## 2012-05-01 NOTE — Progress Notes (Signed)
Subjective: Postpartum Day 1: Cesarean Delivery Patient reports incisional pain, tolerating PO and no problems voiding.    Objective: Vital signs in last 24 hours: Temp:  [97.7 F (36.5 C)-98.9 F (37.2 C)] 98 F (36.7 C) (07/29 0634) Pulse Rate:  [92-136] 99  (07/29 0634) Resp:  [16-22] 20  (07/29 0634) BP: (92-117)/(52-80) 100/65 mmHg (07/29 0634) SpO2:  [95 %-98 %] 97 % (07/29 0634) Weight:  [86.637 kg (191 lb)] 86.637 kg (191 lb) (07/28 1858)  Physical Exam:  General: alert and no distress Lochia: appropriate Uterine Fundus: firm Incision: healing well DVT Evaluation: No evidence of DVT seen on physical exam.   Basename 05/01/12 0525 04/30/12 2020  HGB 9.1* 11.5*  HCT -- 35.2*    Assessment/Plan: Status post Cesarean section. Doing well postoperatively.  Continue current care.  Yong Wahlquist A 05/01/2012, 8:54 AM

## 2012-05-01 NOTE — Progress Notes (Signed)

## 2012-05-02 ENCOUNTER — Encounter (HOSPITAL_COMMUNITY): Payer: Self-pay | Admitting: *Deleted

## 2012-05-02 NOTE — Progress Notes (Signed)
Subjective: Postpartum Day 2: Cesarean Delivery Patient reports tolerating PO, + flatus and no problems voiding.    Objective: Vital signs in last 24 hours: Temp:  [97.2 F (36.2 C)-98.7 F (37.1 C)] 97.8 F (36.6 C) (07/30 0505) Pulse Rate:  [67-92] 81  (07/30 0505) Resp:  [18-19] 18  (07/30 0505) BP: (86-114)/(45-74) 99/65 mmHg (07/30 0505) SpO2:  [95 %-98 %] 98 % (07/29 2030)  Physical Exam:  General: alert and no distress Lochia: appropriate Uterine Fundus: firm Incision: healing well DVT Evaluation: No evidence of DVT seen on physical exam.   Basename 05/01/12 0525 04/30/12 2020  HGB 9.1* 11.5*  HCT -- 35.2*    Assessment/Plan: Status post Cesarean section. Doing well postoperatively.  Continue current care.  HARPER,CHARLES A 05/02/2012, 8:13 AM

## 2012-05-02 NOTE — Plan of Care (Signed)
Problem: Discharge Progression Outcomes Goal: Remove staples per MD order Outcome: Not Applicable Date Met:  05/02/12 Pt has steri strips and internal sutures.

## 2012-05-03 MED ORDER — OXYCODONE-ACETAMINOPHEN 5-325 MG PO TABS: 1.0000 | ORAL_TABLET | ORAL | Status: AC | PRN

## 2012-05-03 MED ORDER — IBUPROFEN 600 MG PO TABS: 600.0000 mg | ORAL_TABLET | Freq: Four times a day (QID) | ORAL | Status: AC

## 2012-05-03 MED ORDER — FUSION PLUS PO CAPS: 1.0000 | ORAL_CAPSULE | Freq: Every day | ORAL | Status: DC

## 2012-05-03 NOTE — Progress Notes (Signed)
Subjective: Postpartum Day 3: Cesarean Delivery Patient reports tolerating PO, + flatus, + BM and no problems voiding.    Objective: Vital signs in last 24 hours: Temp:  [97.8 F (36.6 C)-98.7 F (37.1 C)] 98.1 F (36.7 C) (07/31 0511) Pulse Rate:  [76-87] 78  (07/31 0511) Resp:  [18] 18  (07/31 0511) BP: (96-111)/(64) 96/64 mmHg (07/31 0511) SpO2:  [100 %] 100 % (07/30 1355)  Physical Exam:  General: alert and no distress Lochia: appropriate Uterine Fundus: firm Incision: healing well DVT Evaluation: No evidence of DVT seen on physical exam.   Basename 05/01/12 0525 04/30/12 2020  HGB 9.1* 11.5*  HCT -- 35.2*    Assessment/Plan: Status post Cesarean section. Doing well postoperatively.  Discharge home with standard precautions and return to clinic in 4-6 weeks.  HARPER,CHARLES A 05/03/2012, 8:21 AM

## 2012-05-03 NOTE — Discharge Summary (Signed)
Obstetric Discharge Summary Reason for Admission: onset of labor and Genital Herpes Outbreak Prenatal Procedures: ultrasound Intrapartum Procedures: cesarean: low cervical, transverse Postpartum Procedures: none Complications-Operative and Postpartum: none Hemoglobin  Date Value Range Status  05/01/2012 9.1* 12.0 - 15.0 g/dL Final     DELTA CHECK NOTED     REPEATED TO VERIFY     HCT  Date Value Range Status  04/30/2012 35.2* 36.0 - 46.0 % Final    Physical Exam:  General: alert and no distress Lochia: appropriate Uterine Fundus: firm Incision: healing well DVT Evaluation: No evidence of DVT seen on physical exam.  Discharge Diagnoses: Term Pregnancy-delivered  Discharge Information: Date: 05/03/2012 Activity: pelvic rest Diet: routine Medications: PNV, Ibuprofen, Colace, Iron and Percocet Condition: stable Instructions: refer to practice specific booklet Discharge to: home Follow-up Information    Follow up with Danicia Terhaar A, MD. Schedule an appointment as soon as possible for a visit in 2 weeks.   Contact information:   [address] [place] [address] [phone]          Newborn Data: Live born female  Birth Weight: 8 lb 8.5 oz (3870 g) APGAR: 9, 9  Home with mother.  Brysten Reister A 05/03/2012, 8:38 AM

## 2012-05-08 ENCOUNTER — Ambulatory Visit (HOSPITAL_COMMUNITY)
Admit: 2012-05-08 | Discharge: 2012-05-08 | Disposition: A | Discharge status: Home / Self Care | Payer: Medicaid Other | Attending: Obstetrics & Gynecology | Admitting: Obstetrics & Gynecology

## 2013-06-21 ENCOUNTER — Emergency Department (HOSPITAL_COMMUNITY)
Admission: EM | Admit: 2013-06-21 | Discharge: 2013-06-22 | Disposition: A | Discharge status: Home / Self Care | Payer: Self-pay | Attending: Emergency Medicine | Admitting: Emergency Medicine

## 2013-06-21 ENCOUNTER — Encounter (HOSPITAL_COMMUNITY): Payer: Self-pay | Admitting: *Deleted

## 2013-06-21 DIAGNOSIS — J45909 Unspecified asthma, uncomplicated: Secondary | ICD-10-CM | POA: Insufficient documentation

## 2013-06-21 DIAGNOSIS — X789XXA Intentional self-harm by unspecified sharp object, initial encounter: Secondary | ICD-10-CM | POA: Insufficient documentation

## 2013-06-21 DIAGNOSIS — X838XXA Intentional self-harm by other specified means, initial encounter: Secondary | ICD-10-CM

## 2013-06-21 DIAGNOSIS — Z3202 Encounter for pregnancy test, result negative: Secondary | ICD-10-CM | POA: Insufficient documentation

## 2013-06-21 DIAGNOSIS — R45851 Suicidal ideations: Secondary | ICD-10-CM | POA: Insufficient documentation

## 2013-06-21 DIAGNOSIS — Z87891 Personal history of nicotine dependence: Secondary | ICD-10-CM | POA: Insufficient documentation

## 2013-06-21 DIAGNOSIS — Z8659 Personal history of other mental and behavioral disorders: Secondary | ICD-10-CM | POA: Insufficient documentation

## 2013-06-21 DIAGNOSIS — S61509A Unspecified open wound of unspecified wrist, initial encounter: Secondary | ICD-10-CM | POA: Insufficient documentation

## 2013-06-21 HISTORY — DX: Post-traumatic stress disorder, unspecified: F43.10

## 2013-06-21 LAB — COMPREHENSIVE METABOLIC PANEL
AST: 18 U/L (ref 0–37)
Albumin: 3.9 g/dL (ref 3.5–5.2)
Alkaline Phosphatase: 69 U/L (ref 39–117)
BUN: 6 mg/dL (ref 6–23)
CO2: 26 mEq/L (ref 19–32)
Chloride: 104 mEq/L (ref 96–112)
Creatinine, Ser: 0.72 mg/dL (ref 0.50–1.10)
GFR calc non Af Amer: >90 mL/min (ref >90)
Potassium: 3.8 mEq/L (ref 3.5–5.1)
Total Bilirubin: 0.2 mg/dL — ABNORMAL LOW (ref 0.3–1.2)

## 2013-06-21 LAB — URINALYSIS, ROUTINE W REFLEX MICROSCOPIC
Glucose, UA: NEGATIVE mg/dL
Hgb urine dipstick: NEGATIVE
Ketones, ur: NEGATIVE mg/dL
Leukocytes, UA: NEGATIVE
Protein, ur: NEGATIVE mg/dL
pH: 5.5 (ref 5.0–8.0)

## 2013-06-21 LAB — CBC
HCT: 40.3 % (ref 36.0–46.0)
Hemoglobin: 13.5 g/dL (ref 12.0–15.0)
MCH: 29.3 pg (ref 26.0–34.0)
MCHC: 33.5 g/dL (ref 30.0–36.0)
MCV: 87.4 fL (ref 78.0–100.0)
RBC: 4.61 MIL/uL (ref 3.87–5.11)

## 2013-06-21 LAB — RAPID URINE DRUG SCREEN, HOSP PERFORMED
Cocaine: NOT DETECTED
Opiates: NOT DETECTED

## 2013-06-21 LAB — ETHANOL: Alcohol, Ethyl (B): <11 mg/dL (ref 0–11)

## 2013-06-21 MED ORDER — LORAZEPAM 1 MG PO TABS: 1.0000 mg | ORAL_TABLET | Freq: Three times a day (TID) | ORAL | Status: DC | PRN

## 2013-06-21 NOTE — ED Notes (Signed)
Pt arrived to unit with no s/s of distress noted at this time. Pt pleasant and cooperative with assessment. Pt denies Si/HI or any hallucinations. Pt states "I have just been going through a lot and thinking about my baby that dies last year and then arguing with my boyfriend. It all just got to me". Pt verbally contracts for safety.

## 2013-06-21 NOTE — ED Notes (Addendum)
Pt has been changed into blue scrubs.  Pt has shirt, bra, underwear, jeans, sock shoes, NCDL, hair tie,and 3 body rings.  Pt has been seen and wanded by security.    Pt's items are in TCU locker 32.

## 2013-06-21 NOTE — ED Notes (Signed)
Consuella Lose from [facility] called for info on pt regarding IVC paperwork.

## 2013-06-21 NOTE — ED Notes (Signed)
Per EMS report: pt from home: Hx of depression and PTSD and noncompliant with medications and therapy.  This evening pt got into the verbal argument with SO.  Pt cut her left wrist.  Bleeding controlled.  Pt intended to hurt herself but not to kill herself or hurt others.  Depression and PTSD is from losing a baby about a year ago.  Pt a/o x 4.  Skin warm and dry.  No harmful behaviors observed.  Pt endorses use of marijuana this evening.

## 2013-06-21 NOTE — ED Notes (Signed)
Bed: RESB Expected date: 06/21/13 Expected time: 8:14 PM Means of arrival: Ambulance Comments: Self inflicted lacerations

## 2013-06-21 NOTE — ED Provider Notes (Signed)
CSN: [id number]     Arrival date & time 06/21/13  2023 History   First MD Initiated Contact with Patient 06/21/13 2035     Chief Complaint  Patient presents with  . Laceration  . Medical Clearance   (Consider location/radiation/quality/duration/timing/severity/associated sxs/prior Treatment) HPI Patient presents after a suicide attempt. This history bipolar disorder, depression, takes no medications currently. Today, after some time of being depressed, upset at her life situation patient cut her left wrist with a steak knife.  As the only injury, and the patient denies taking any medication, and performing any other acts of self-harm. Bleeding was controlled prior to my evaluation. On my evaluation she denies any pain, cause of hurting others, current suicidal thoughts, or any other complaints. Past Medical History  Diagnosis Date  . Asthma     as a child  . Abnormal Pap smear   . Depression   . PTSD (post-traumatic stress disorder)    Past Surgical History  Procedure Laterality Date  . Colposcopy w/ biopsy / curettage    . Cesarean section  04/30/2012    Procedure: CESAREAN SECTION;  Surgeon: Antionette Char, MD;  Location: [facility];  Service: Gynecology;  Laterality: N/A;  Primary cesarean section of baby girl at 2113   APGAR  9/9   Family History  Problem Relation Age of Onset  . Other Neg Hx    History  Substance Use Topics  . Smoking status: Former Smoker    Quit date: 10/30/2010  . Smokeless tobacco: Not on file  . Alcohol Use: No   OB History   Grav Para Term Preterm Abortions TAB SAB Ect Mult Living   1 1 1       1      Review of Systems  All other systems reviewed and are negative.    Allergies  Review of patient's allergies indicates no known allergies.  Home Medications  No current outpatient prescriptions on file. BP 100/76  Pulse 85  Temp(Src) 98.2 F (36.8 C) (Oral)  Resp 20  SpO2 97%  LMP 06/13/2013 Physical Exam  Nursing note and vitals  reviewed. Constitutional: She is oriented to person, place, and time. She appears well-developed and well-nourished. No distress.  HENT:  Head: Normocephalic and atraumatic.  Scars from multiple prior piercings  Eyes: Conjunctivae and EOM are normal.  Cardiovascular: Normal rate and regular rhythm.   Pulmonary/Chest: Effort normal and breath sounds normal. No stridor. No respiratory distress.  Abdominal: She exhibits no distension.  Musculoskeletal: She exhibits no edema.  Aside from the laceration on the left wrist, the musculoskeletal exam is unremarkable the  Neurological: She is alert and oriented to person, place, and time. No cranial nerve deficit.  Skin: Skin is warm and dry.     Psychiatric: She has a normal mood and affect. Her speech is normal and behavior is normal. She expresses suicidal ideation. She expresses no suicidal plans.    ED Course  Procedures (including critical care time) Labs Review Labs Reviewed  COMPREHENSIVE METABOLIC PANEL - Abnormal; Notable for the following:    Glucose, Bld 103 (*)    Total Bilirubin 0.2 (*)    All other components within normal limits  CBC  ETHANOL  URINE RAPID DRUG SCREEN (HOSP PERFORMED)  URINALYSIS, ROUTINE W REFLEX MICROSCOPIC  PREGNANCY, URINE   Imaging Review No results found. LACERATION REPAIR Performed by: Gerhard Munch Authorized by: Gerhard Munch Consent: Verbal consent obtained. Risks and benefits: risks, benefits and alternatives were discussed Consent given by:  patient Patient identity confirmed: provided demographic data Prepped and Draped in normal sterile fashion Wound explored  Laceration Location: Left wrist  Laceration Length: 3cm  No Foreign Bodies seen or palpated  Irrigation method: H2O - copious Amount of cleaning: standard  Skin closure: steri-strips and dermabond  Number of sutures: 2  Technique: close approximation  Patient tolerance: Patient tolerated the procedure well with  no immediate complications.  MDM  No diagnosis found. Patient presents after self harm in a suicidal gesture.  On my exam the patient is awake and alert, appropriately interactive.  Patient has a history of depression, bipolar, some dyspnea [place] hasn't concerning for elevated suicide risk.  Patient is medically clear for psychiatric evaluation.    Gerhard Munch, MD 06/21/13 2147

## 2013-06-22 DIAGNOSIS — X838XXA Intentional self-harm by other specified means, initial encounter: Secondary | ICD-10-CM
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
## 2013-06-22 NOTE — ED Notes (Signed)
Pt is awake and alert, pleasant and cooperative. Patient denies HI, SI AH or VH. Discharge vitals [id number]/[id number] RR 16 and unlabored. Pt has outpatient treatment scheduled. Will continue to monitor for safety. Patient escorted to lobby without incident. T.Melvyn Neth RN

## 2013-06-22 NOTE — Progress Notes (Addendum)
Per discussion with psychiatrist patient stable for discharge home. CSW met with pt to discuss outpatient follow up. Pt stated that her mcd is inactive at this time. Pt plans to follow up with [facility] for counseling and medication management. CSW and pt also discuss mobile crisis. Psychiatrist provided rx for cymbalta and trazadone. EDP aware of discharge.   Catha Gosselin, LCSW [phone]  ED CSW .06/22/2013 1043am

## 2013-06-22 NOTE — ED Notes (Signed)
Pt is awake and alert, pleasant and cooperative. Interacts well with staff and others. Pt denies HI,SI,AH or VH. Will contiune to monitor for safety

## 2013-06-22 NOTE — ED Notes (Signed)
Telepsych completed. Pt cooperative with assessment.

## 2013-06-22 NOTE — ED Notes (Signed)
Patient served [employer] by [employer]. Pt accepting. First opinion completed by Dr. Norlene Campbell.

## 2013-06-22 NOTE — Consult Note (Addendum)
  Psychiatric Specialty Exam: @PHYSEXAMBYAGE2 @  @ROS @  Blood pressure 109/71, pulse 68, temperature 97.7 F (36.5 C), temperature source Oral, resp. rate 18, last menstrual period 06/13/2013, SpO2 99.00%.There is no weight on file to calculate BMI.  General Appearance: Fairly Groomed  Patent attorney::  Good  Speech:  Clear and Coherent and Normal Rate  Volume:  Normal  Mood:  Euthymic  Affect:  Appropriate  Thought Process:  Coherent, Goal Directed and Logical  Orientation:  Full (Time, Place, and Person)  Thought Content:  Negative  Suicidal Thoughts:  No  Homicidal Thoughts:  No  Memory:  Immediate;   Good Recent;   Good Remote;   Good  Judgement:  Intact  Insight:  Fair  Psychomotor Activity:  Normal  Concentration:  Good  Recall:  Good  Akathisia:  Negative  Handed:  Right  AIMS (if indicated):     Assets:  Communication Skills Desire for Improvement Financial Resources/Insurance Housing Intimacy Leisure Time Physical Health Resilience Social Support Talents/Skills Transportation Vocational/Educational  Sleep:     Cheryl [name]  Says she has had a hard time since her 31 month old infant died last year.  Says yesterday she had an argument with her boyfriend, he got mad and left and she had some suicidal thoughts.  Today she is not suicidal, wants to go home, plans to follow up with outpatient therapy.  I recommend discharge home today. Prescriptions written for citalopram 20 mg daily #30 and trazodone 50 mg # 30

## 2013-09-01 ENCOUNTER — Emergency Department (HOSPITAL_COMMUNITY)
Admission: EM | Admit: 2013-09-01 | Discharge: 2013-09-01 | Disposition: A | Discharge status: Home / Self Care | Payer: Medicaid Other | Attending: Emergency Medicine | Admitting: Emergency Medicine

## 2013-09-01 ENCOUNTER — Encounter (HOSPITAL_COMMUNITY): Payer: Self-pay | Admitting: Emergency Medicine

## 2013-09-01 DIAGNOSIS — Z87891 Personal history of nicotine dependence: Secondary | ICD-10-CM | POA: Insufficient documentation

## 2013-09-01 DIAGNOSIS — O9989 Other specified diseases and conditions complicating pregnancy, childbirth and the puerperium: Secondary | ICD-10-CM | POA: Insufficient documentation

## 2013-09-01 DIAGNOSIS — Z8659 Personal history of other mental and behavioral disorders: Secondary | ICD-10-CM | POA: Insufficient documentation

## 2013-09-01 DIAGNOSIS — B86 Scabies: Secondary | ICD-10-CM

## 2013-09-01 DIAGNOSIS — Z79899 Other long term (current) drug therapy: Secondary | ICD-10-CM | POA: Insufficient documentation

## 2013-09-01 DIAGNOSIS — J45909 Unspecified asthma, uncomplicated: Secondary | ICD-10-CM | POA: Insufficient documentation

## 2013-09-01 MED ORDER — PERMETHRIN 5 % EX CREA: TOPICAL_CREAM | CUTANEOUS | Status: DC
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
## 2013-09-01 NOTE — ED Provider Notes (Signed)
CSN: [id number]     Arrival date & time 09/01/13  0118 History   First MD Initiated Contact with Patient 09/01/13 0200     Chief Complaint  Patient presents with  . Poison Oak   HPI  History provided by the patient and significant other. Patient is a 27 year old female who is approximately one month pregnant and presents with concerns for pruritic rash. Patient reports having pruritic rash over her arms and legs and lower abdomen off and on for the past one month. She states that she and her [employer] were concerned it could be poison oak. 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 He recently has had rash and itching to his fingers hands and arms. Patient states she is not directly come in contact with any outdoor plants. Rash is described as small pruritic spots. Denies having similar symptoms previously. No other aggravating or alleviating factors. No other associated symptoms.   Past Medical History  Diagnosis Date  . Asthma     as a child  . Abnormal Pap smear   . Depression   . PTSD (post-traumatic stress disorder)    Past Surgical History  Procedure Laterality Date  . Colposcopy w/ biopsy / curettage    . Cesarean section  04/30/2012    Procedure: CESAREAN SECTION;  Surgeon: Antionette Char, MD;  Location: [facility];  Service: Gynecology;  Laterality: N/A;  Primary cesarean section of baby girl at 2113   APGAR  9/9   Family History  Problem Relation Age of Onset  . Other Neg Hx    History  Substance Use Topics  . Smoking status: Former Smoker    Quit date: 10/30/2010  . Smokeless tobacco: Not on file  . Alcohol Use: No   OB History   Grav Para Term Preterm Abortions TAB SAB Ect Mult Living   2 1 1       1      Review of Systems  Constitutional: Negative for fever, chills and diaphoresis.  Respiratory: Negative for shortness of breath and wheezing.   Skin: Positive for rash.  All other systems reviewed and  are negative.    Allergies  Review of patient's allergies indicates no known allergies.  Home Medications   Current Outpatient Rx  Name  Route  Sig  Dispense  Refill  . multivitamin prental (TRINATAL) 60-1 MG TABS tablet   Oral   Take 1 tablet by mouth daily.          BP 97/65  Pulse 72  Temp(Src) 97.7 F (36.5 C) (Oral)  Resp 22  Wt 173 lb 4.8 oz (78.608 kg)  SpO2 100%  LMP 06/13/2013  Breastfeeding? No Physical Exam  Nursing note and vitals reviewed. Constitutional: She is oriented to person, place, and time. She appears well-developed and well-nourished. No distress.  HENT:  Head: Normocephalic.  Cardiovascular: Normal rate and regular rhythm.   Pulmonary/Chest: Effort normal and breath sounds normal. No respiratory distress. She has no wheezes. She has no rales.  Abdominal: Soft. There is no tenderness.  Neurological: She is alert and oriented to person, place, and time.  Skin: Skin is warm and dry. Rash noted.  Fine grouped papular lesions to the upper and lower extremities. They're similar papular rash across the lower abdomen. Some secondary excoriations present. No significant erythema. No induration.  Psychiatric: She has a normal mood and affect. Her behavior is normal.    ED Course  Procedures   DIAGNOSTIC STUDIES: Oxygen Saturation is 100% on room air.    COORDINATION OF CARE:  Nursing notes reviewed. Vital signs reviewed. Initial pt interview and examination performed.   3:10 AM-patient seen and evaluated. Both she and the other half similar rash. Partner has rash to the web spacing of fingers. Rashes lasted over a month. Do not suspect this is poison oak or contact dermatitis. I do have concern for scabies given their living proximity and similar rash. Discussed treatment plan with pt at bedside, which includes permethrin cream. Pt agrees with plan.     MDM   1. Scabies        Angus Seller, PA-C 09/02/13 [phone]

## 2013-09-03 NOTE — ED Provider Notes (Signed)
Medical screening examination/treatment/procedure(s) were performed by non-physician practitioner and as supervising physician I was immediately available for consultation/collaboration.  EKG Interpretation   None         Mataeo Ingwersen S Anterrio Mccleery, MD 09/03/13 0349 

## 2013-09-18 ENCOUNTER — Emergency Department (HOSPITAL_COMMUNITY): Payer: Medicaid Other

## 2013-09-18 ENCOUNTER — Emergency Department (HOSPITAL_COMMUNITY)
Admission: EM | Admit: 2013-09-18 | Discharge: 2013-09-18 | Disposition: A | Discharge status: Home / Self Care | Payer: Medicaid Other | Attending: Emergency Medicine | Admitting: Emergency Medicine

## 2013-09-18 ENCOUNTER — Encounter (HOSPITAL_COMMUNITY): Payer: Self-pay | Admitting: Emergency Medicine

## 2013-09-18 DIAGNOSIS — O9989 Other specified diseases and conditions complicating pregnancy, childbirth and the puerperium: Secondary | ICD-10-CM | POA: Insufficient documentation

## 2013-09-18 DIAGNOSIS — Z8659 Personal history of other mental and behavioral disorders: Secondary | ICD-10-CM | POA: Insufficient documentation

## 2013-09-18 DIAGNOSIS — J45909 Unspecified asthma, uncomplicated: Secondary | ICD-10-CM | POA: Insufficient documentation

## 2013-09-18 DIAGNOSIS — Z79899 Other long term (current) drug therapy: Secondary | ICD-10-CM | POA: Insufficient documentation

## 2013-09-18 DIAGNOSIS — O209 Hemorrhage in early pregnancy, unspecified: Secondary | ICD-10-CM

## 2013-09-18 DIAGNOSIS — Z87891 Personal history of nicotine dependence: Secondary | ICD-10-CM | POA: Insufficient documentation

## 2013-09-18 HISTORY — DX: Bipolar disorder, unspecified: F31.9

## 2013-09-18 LAB — URINALYSIS, ROUTINE W REFLEX MICROSCOPIC
Glucose, UA: NEGATIVE mg/dL
Hgb urine dipstick: NEGATIVE
Ketones, ur: NEGATIVE mg/dL
Leukocytes, UA: NEGATIVE
Protein, ur: NEGATIVE mg/dL
Specific Gravity, Urine: 1.019 (ref 1.005–1.030)
Urobilinogen, UA: 0.2 mg/dL (ref 0.0–1.0)

## 2013-09-18 LAB — CBC
HCT: 36.7 % (ref 36.0–46.0)
MCH: 30.2 pg (ref 26.0–34.0)
MCV: 86.6 fL (ref 78.0–100.0)
RBC: 4.24 MIL/uL (ref 3.87–5.11)
WBC: 8.8 10*3/uL (ref 4.0–10.5)

## 2013-09-18 LAB — WET PREP, GENITAL
Clue Cells Wet Prep HPF POC: NONE SEEN
WBC, Wet Prep HPF POC: NONE SEEN
Yeast Wet Prep HPF POC: NONE SEEN

## 2013-09-18 LAB — BASIC METABOLIC PANEL
BUN: 7 mg/dL (ref 6–23)
CO2: 26 mEq/L (ref 19–32)
Calcium: 9.3 mg/dL (ref 8.4–10.5)
Chloride: 101 mEq/L (ref 96–112)
Creatinine, Ser: 0.55 mg/dL (ref 0.50–1.10)

## 2013-09-18 NOTE — ED Notes (Signed)
Pt is [redacted] weeks pregnant and complaining of lower abdominal cramping pain with 2 episode of vaginal spotting noted when going to bathroom.  G-2 P-0 A-0.  Pt states LMP was in October 2014

## 2013-09-18 NOTE — ED Notes (Signed)
Patient transported to Ultrasound 

## 2013-09-18 NOTE — ED Provider Notes (Signed)
CSN: [id number]     Arrival date & time 09/18/13  0218 History   First MD Initiated Contact with Patient 09/18/13 0515     Chief Complaint  Patient presents with  . Vaginal Bleeding   (Consider location/radiation/quality/duration/timing/severity/associated sxs/prior Treatment) HPI 27 yo female, G2P1 at 9 weeks and 3 days presents to the ER from home with complaint of vaginal bleeding.  Pt reports some blood noted on towel after showering yesterday morning.  She has also had two episodes of blood noted on toilet paper.  No abdominal cramping, no blood on underwear.  Pt has not yet established prenatal care.  No problems with prior pregnancy.  Past Medical History  Diagnosis Date  . Asthma     as a child  . Abnormal Pap smear   . Depression   . PTSD (post-traumatic stress disorder)   . Bipolar 1 disorder    Past Surgical History  Procedure Laterality Date  . Colposcopy w/ biopsy / curettage    . Cesarean section  04/30/2012    Procedure: CESAREAN SECTION;  Surgeon: Antionette Char, MD;  Location: [facility];  Service: Gynecology;  Laterality: N/A;  Primary cesarean section of baby girl at 2113   APGAR  9/9   Family History  Problem Relation Age of Onset  . Other Neg Hx    History  Substance Use Topics  . Smoking status: Former Smoker    Quit date: 10/30/2010  . Smokeless tobacco: Not on file  . Alcohol Use: No   OB History   Grav Para Term Preterm Abortions TAB SAB Ect Mult Living   2 1 1       1      Review of Systems  All other systems reviewed and are negative.    Allergies  Review of patient's allergies indicates no known allergies.  Home Medications   Current Outpatient Rx  Name  Route  Sig  Dispense  Refill  . multivitamin prental (TRINATAL) 60-1 MG TABS tablet   Oral   Take 1 tablet by mouth daily.         . permethrin (ELIMITE) 5 % cream      Apply to affected area once   60 g   0    BP 103/53  Pulse 85  Temp(Src) 98.1 F (36.7 C) (Oral)   Resp 18  Ht 5\' 4"  (1.626 m)  Wt 170 lb 1.6 oz (77.157 kg)  BMI 29.18 kg/m2  SpO2 98%  LMP 06/13/2013 Physical Exam  Nursing note and vitals reviewed. Constitutional: She is oriented to person, place, and time. She appears well-developed and well-nourished.  HENT:  Head: Normocephalic and atraumatic.  Right Ear: External ear normal.  Left Ear: External ear normal.  Nose: Nose normal.  Mouth/Throat: Oropharynx is clear and moist.  Eyes: Conjunctivae and EOM are normal. Pupils are equal, round, and reactive to light.  Neck: Normal range of motion. Neck supple. No JVD present. No tracheal deviation present. No thyromegaly present.  Cardiovascular: Normal rate, regular rhythm, normal heart sounds and intact distal pulses.  Exam reveals no gallop and no friction rub.   No murmur heard. Pulmonary/Chest: Effort normal and breath sounds normal. No stridor. No respiratory distress. She has no wheezes. She has no rales. She exhibits no tenderness.  Abdominal: Soft. Bowel sounds are normal. She exhibits no distension and no mass. There is no tenderness. There is no rebound and no guarding.  Genitourinary:  External genitalia normal Vagina without discharge Cervix closed no  lesions No cervical motion tenderness Adnexa palpated, no masses or tenderness noted Bladder palpated no tenderness Uterus palpated no masses or tenderness    Musculoskeletal: Normal range of motion. She exhibits no edema and no tenderness.  Lymphadenopathy:    She has no cervical adenopathy.  Neurological: She is alert and oriented to person, place, and time. She exhibits normal muscle tone. Coordination normal.  Skin: Skin is warm and dry. No rash noted. No erythema. No pallor.  Psychiatric: She has a normal mood and affect. Her behavior is normal. Judgment and thought content normal.    ED Course  Procedures (including critical care time) Labs Review Labs Reviewed  HCG, QUANTITATIVE, PREGNANCY - Abnormal; Notable  for the following:    hCG, Beta Chain, Quant, S 40981 (*)    All other components within normal limits  POCT PREGNANCY, URINE - Abnormal; Notable for the following:    Preg Test, Ur POSITIVE (*)    All other components within normal limits  WET PREP, GENITAL  GC/CHLAMYDIA PROBE AMP  CBC  BASIC METABOLIC PANEL  URINALYSIS, ROUTINE W REFLEX MICROSCOPIC  ABO/RH   Imaging Review US Ob Comp Less 14 Wks  09/18/2013   CLINICAL DATA:  Patient known to be pregnant and presents with spotting. Quantitative the BCG 84,959. Gestational age per LMP 9 weeks 3 days.  EXAM: OBSTETRIC <14 WK ULTRASOUND  TECHNIQUE: Transabdominal ultrasound was performed for evaluation of the gestation as well as the maternal uterus and adnexal regions.  COMPARISON:  None.  FINDINGS: Intrauterine gestational sac: Visualized/normal in shape.  Yolk sac:  Visualize/within normal.  Embryo:  Single/within normal.  Cardiac Activity: Visualized.  Heart Rate: 155 bpm  CRL:   29.8  mm   9 w 6 d                  [place] EDC: 04/17/2014  Maternal uterus/adnexae: Ovary is normal in size, shape and position. Evidence of a 2.9 cm simple appearing left ovarian cyst likely a corpus luteal cyst. No free fluid in the pelvis.  IMPRESSION: Single live IUP with estimated gestational age [redacted] weeks 6 days.   Electronically Signed   By: Elberta Fortis M.D.   On: 09/18/2013 07:41    EKG Interpretation   None       MDM   1. Bleeding in early pregnancy    27 yo female with vaginal bleeding in early pregnancy.  No bleeding noted on vaginal exam.  Will get US ob for possible ectopic vs miscarriage.   Olivia Mackie, MD 09/18/13 2010

## 2013-09-18 NOTE — ED Notes (Signed)
Patient also states she is cramping

## 2013-09-18 NOTE — ED Notes (Signed)
Patient stated when she took a shower earlier she noticed some blood on the towel.  Patient is [redacted] weeks pregnant.  PTA used the BR and noticed more blood

## 2013-09-20 LAB — GC/CHLAMYDIA PROBE AMP
CT Probe RNA: NEGATIVE
GC Probe RNA: NEGATIVE

## 2013-11-03 ENCOUNTER — Inpatient Hospital Stay (HOSPITAL_COMMUNITY)
Admission: AD | Admit: 2013-11-03 | Discharge: 2013-11-03 | Disposition: A | Discharge status: Home / Self Care | Payer: Medicaid Other | Source: Ambulatory Visit | Attending: Obstetrics and Gynecology | Admitting: Obstetrics and Gynecology

## 2013-11-03 ENCOUNTER — Encounter (HOSPITAL_COMMUNITY): Payer: Self-pay

## 2013-11-03 DIAGNOSIS — B001 Herpesviral vesicular dermatitis: Secondary | ICD-10-CM

## 2013-11-03 DIAGNOSIS — R21 Rash and other nonspecific skin eruption: Secondary | ICD-10-CM

## 2013-11-03 DIAGNOSIS — O99891 Other specified diseases and conditions complicating pregnancy: Secondary | ICD-10-CM | POA: Insufficient documentation

## 2013-11-03 DIAGNOSIS — A6000 Herpesviral infection of urogenital system, unspecified: Secondary | ICD-10-CM | POA: Insufficient documentation

## 2013-11-03 DIAGNOSIS — O9989 Other specified diseases and conditions complicating pregnancy, childbirth and the puerperium: Principal | ICD-10-CM

## 2013-11-03 DIAGNOSIS — O98519 Other viral diseases complicating pregnancy, unspecified trimester: Secondary | ICD-10-CM | POA: Insufficient documentation

## 2013-11-03 DIAGNOSIS — Z87891 Personal history of nicotine dependence: Secondary | ICD-10-CM | POA: Insufficient documentation

## 2013-11-03 LAB — URINALYSIS, ROUTINE W REFLEX MICROSCOPIC
Bilirubin Urine: NEGATIVE
Glucose, UA: NEGATIVE mg/dL
Hgb urine dipstick: NEGATIVE
Ketones, ur: NEGATIVE mg/dL
Leukocytes, UA: NEGATIVE
NITRITE: NEGATIVE
PROTEIN: NEGATIVE mg/dL
SPECIFIC GRAVITY, URINE: 1.02 (ref 1.005–1.030)
UROBILINOGEN UA: 0.2 mg/dL (ref 0.0–1.0)
pH: 7.5 (ref 5.0–8.0)

## 2013-11-03 MED ORDER — VALACYCLOVIR HCL 1 G PO TABS: 1000.0000 mg | ORAL_TABLET | Freq: Two times a day (BID) | ORAL | Status: AC

## 2013-11-03 MED ORDER — TRIAMCINOLONE ACETONIDE 0.1 % EX CREA: 1.0000 "application " | TOPICAL_CREAM | Freq: Two times a day (BID) | CUTANEOUS | Status: DC

## 2013-11-03 NOTE — MAU Note (Signed)
Pt presents complaining of a rash on her face that started 3 days ago. History of HSV. Denies vaginal bleeding and discharge.

## 2013-11-03 NOTE — MAU Provider Note (Signed)
  History     CSN: [id number]  Arrival date and time: 11/03/13 1416   None     Chief Complaint  Patient presents with  . Rash   HPI Cheryl [name] is a 28 y.o. G2P1001 at 4249w0d. She presents with c/o facial rash x 3 d. It started on L chin, is spreading across cheeks. Is red, raised and itchy. She has current oral HSV outbreak. She also has one area between fingers L hand. Denies any other c/o. Last PNV 3 d ago with Dr Ellyn HackBovard.     Past Medical History  Diagnosis Date  . Asthma     as a child  . Abnormal Pap smear   . Depression   . PTSD (post-traumatic stress disorder)   . Bipolar 1 disorder     Past Surgical History  Procedure Laterality Date  . Colposcopy w/ biopsy / curettage    . Cesarean section  04/30/2012    Procedure: CESAREAN SECTION;  Surgeon: Antionette CharLisa Jackson-Moore, MD;  Location: [facility];  Service: Gynecology;  Laterality: N/A;  Primary cesarean section of baby girl at 2113   APGAR  9/9    Family History  Problem Relation Age of Onset  . Other Neg Hx     History  Substance Use Topics  . Smoking status: Former Smoker    Quit date: 10/30/2010  . Smokeless tobacco: Not on file  . Alcohol Use: No    Allergies: No Known Allergies  Prescriptions prior to admission  Medication Sig Dispense Refill  . Prenatal Vit-Fe Fumarate-FA (PRENATAL MULTIVITAMIN) TABS tablet Take 1 tablet by mouth daily at 12 noon.        Review of Systems  Constitutional: Negative for fever and chills.  Gastrointestinal: Negative for abdominal pain.  Genitourinary: Negative for dysuria, urgency and frequency.  Skin: Positive for itching and rash.   Physical Exam   Blood pressure 111/73, pulse 91, temperature 98.4 F (36.9 C), temperature source Oral, resp. rate 18, last menstrual period 06/13/2013.  Physical Exam  Nursing note and vitals reviewed. Constitutional: She is oriented to person, place, and time. She appears well-developed and well-nourished.  Musculoskeletal: Normal  range of motion.  Neurological: She is alert and oriented to person, place, and time.  Skin: Skin is warm and dry. Rash noted. There is erythema.  Scattered red raised rash  across cheeks HSV outbreak L corner  mouth  Psychiatric: She has a normal mood and affect. Her behavior is normal.    MAU Course  Procedures  MDM  Assessment and Plan  [redacted] wks EGA Allergic rash face-tx with topical steriod cream Oral HSV outbreak-Rx Valtrex Keep next prenatal appt  [name] M. 11/03/2013, 2:58 PM

## 2014-04-01 ENCOUNTER — Encounter (HOSPITAL_COMMUNITY): Payer: Self-pay | Admitting: Pharmacy Technician

## 2014-04-10 ENCOUNTER — Encounter (HOSPITAL_COMMUNITY): Payer: Self-pay

## 2014-04-12 ENCOUNTER — Encounter (HOSPITAL_COMMUNITY)
Admission: RE | Admit: 2014-04-12 | Discharge: 2014-04-12 | Disposition: A | Discharge status: Home / Self Care | Payer: Medicaid Other | Source: Ambulatory Visit | Attending: Obstetrics and Gynecology | Admitting: Obstetrics and Gynecology

## 2014-04-12 ENCOUNTER — Encounter (HOSPITAL_COMMUNITY): Payer: Self-pay

## 2014-04-12 LAB — TYPE AND SCREEN
ABO/RH(D): O POS
Antibody Screen: NEGATIVE

## 2014-04-12 LAB — CBC
HCT: 33.8 % — ABNORMAL LOW (ref 36.0–46.0)
Hemoglobin: 10.9 g/dL — ABNORMAL LOW (ref 12.0–15.0)
MCH: 26.4 pg (ref 26.0–34.0)
MCHC: 32.2 g/dL (ref 30.0–36.0)
MCV: 81.8 fL (ref 78.0–100.0)
PLATELETS: 217 10*3/uL (ref 150–400)
RBC: 4.13 MIL/uL (ref 3.87–5.11)
RDW: 13.9 % (ref 11.5–15.5)
WBC: 8.3 10*3/uL (ref 4.0–10.5)

## 2014-04-12 LAB — ABO/RH: ABO/RH(D): O POS

## 2014-04-12 NOTE — Patient Instructions (Signed)
Cheryl [name]  04/12/2014   Your procedure is scheduled on:  04/16/14  Enter through the Main Entr[facility] of [facility] at 730 AM.  Pick up the phone at the desk and dial [phone].   Call this number if you have problems the morning of surgery: [phone]   Remember:   Do not eat food:After Midnight.  Do not drink clear liquids: After Midnight.  Take these medicines the morning of surgery with A SIP OF WATER: NA   Do not wear jewelry, make-up or nail polish.  Do not wear lotions, powders, or perfumes. You may wear deodorant.  Do not shave 48 hours prior to surgery.  Do not bring valuables to the hospital.  [facility] is not   responsible for any belongings or valuables brought to the hospital.  Contacts, dentures or bridgework may not be worn into surgery.  Leave suitcase in the car. After surgery it may be brought to your room.  For patients admitted to the hospital, checkout time is 11:00 AM the day of              discharge.   Patients discharged the day of surgery will not be allowed to drive             home.  Name and phone number of your driver: NA  Special Instructions:      Please read over the following fact sheets that you were given:   Surgical Site Infection Prevention

## 2014-04-13 LAB — RPR

## 2014-04-14 ENCOUNTER — Encounter (HOSPITAL_COMMUNITY): Payer: Self-pay

## 2014-04-14 ENCOUNTER — Inpatient Hospital Stay (HOSPITAL_COMMUNITY): Payer: Medicaid Other

## 2014-04-14 ENCOUNTER — Inpatient Hospital Stay (HOSPITAL_COMMUNITY)
Admission: AD | Admit: 2014-04-14 | Discharge: 2014-04-16 | DRG: 765 | Disposition: A | Discharge status: Home / Self Care | Payer: Medicaid Other | Source: Ambulatory Visit | Attending: Obstetrics and Gynecology | Admitting: Obstetrics and Gynecology

## 2014-04-14 ENCOUNTER — Encounter (HOSPITAL_COMMUNITY): Payer: Medicaid Other

## 2014-04-14 ENCOUNTER — Encounter (HOSPITAL_COMMUNITY)
Admission: AD | Disposition: A | Discharge status: Home / Self Care | Payer: Self-pay | Source: Ambulatory Visit | Attending: Obstetrics and Gynecology

## 2014-04-14 DIAGNOSIS — O98519 Other viral diseases complicating pregnancy, unspecified trimester: Secondary | ICD-10-CM | POA: Diagnosis present

## 2014-04-14 DIAGNOSIS — Z87891 Personal history of nicotine dependence: Secondary | ICD-10-CM | POA: Diagnosis not present

## 2014-04-14 DIAGNOSIS — F319 Bipolar disorder, unspecified: Secondary | ICD-10-CM | POA: Diagnosis present

## 2014-04-14 DIAGNOSIS — O34219 Maternal care for unspecified type scar from previous cesarean delivery: Principal | ICD-10-CM | POA: Diagnosis present

## 2014-04-14 DIAGNOSIS — O479 False labor, unspecified: Secondary | ICD-10-CM | POA: Diagnosis present

## 2014-04-14 DIAGNOSIS — Z98891 History of uterine scar from previous surgery: Secondary | ICD-10-CM

## 2014-04-14 DIAGNOSIS — A6000 Herpesviral infection of urogenital system, unspecified: Secondary | ICD-10-CM | POA: Diagnosis present

## 2014-04-14 DIAGNOSIS — O99344 Other mental disorders complicating childbirth: Secondary | ICD-10-CM | POA: Diagnosis present

## 2014-04-14 HISTORY — DX: Herpesviral infection, unspecified: B00.9

## 2014-04-14 LAB — TYPE AND SCREEN
ABO/RH(D): O POS
ANTIBODY SCREEN: NEGATIVE

## 2014-04-14 SURGERY — Surgical Case
Anesthesia: Spinal

## 2014-04-14 MED ORDER — ONDANSETRON HCL 4 MG/2ML IJ SOLN
INTRAMUSCULAR | Status: DC | PRN
  Administered 2014-04-14: 4 mg via INTRAVENOUS

## 2014-04-14 MED ORDER — SENNOSIDES-DOCUSATE SODIUM 8.6-50 MG PO TABS
2.0000 | ORAL_TABLET | ORAL | Status: DC
  Administered 2014-04-14 – 2014-04-15 (×2): 2 via ORAL
  Filled 2014-04-14 (×2): qty 2

## 2014-04-14 MED ORDER — METOCLOPRAMIDE HCL 5 MG/ML IJ SOLN: 10.0000 mg | Freq: Three times a day (TID) | INTRAMUSCULAR | Status: DC | PRN

## 2014-04-14 MED ORDER — NALBUPHINE HCL 10 MG/ML IJ SOLN: 5.0000 mg | INTRAMUSCULAR | Status: DC | PRN

## 2014-04-14 MED ORDER — CITRIC ACID-SODIUM CITRATE 334-500 MG/5ML PO SOLN
30.0000 mL | Freq: Once | ORAL | Status: AC
  Administered 2014-04-14: 30 mL via ORAL

## 2014-04-14 MED ORDER — NALOXONE HCL 0.4 MG/ML IJ SOLN: 0.4000 mg | INTRAMUSCULAR | Status: DC | PRN

## 2014-04-14 MED ORDER — MEPERIDINE HCL 25 MG/ML IJ SOLN: 6.2500 mg | INTRAMUSCULAR | Status: DC | PRN

## 2014-04-14 MED ORDER — PHENYLEPHRINE 8 MG IN D5W 100 ML (0.08MG/ML) PREMIX OPTIME
INJECTION | INTRAVENOUS | Status: DC | PRN
  Administered 2014-04-14: 60 ug/min via INTRAVENOUS

## 2014-04-14 MED ORDER — LACTATED RINGERS IV SOLN
INTRAVENOUS | Status: DC | PRN
  Administered 2014-04-14: 11:00:00 via INTRAVENOUS

## 2014-04-14 MED ORDER — SIMETHICONE 80 MG PO CHEW
80.0000 mg | CHEWABLE_TABLET | ORAL | Status: DC
  Administered 2014-04-14 – 2014-04-15 (×2): 80 mg via ORAL
  Filled 2014-04-14 (×2): qty 1

## 2014-04-14 MED ORDER — LANOLIN HYDROUS EX OINT: 1.0000 "application " | TOPICAL_OINTMENT | CUTANEOUS | Status: DC | PRN

## 2014-04-14 MED ORDER — LACTATED RINGERS IV SOLN
INTRAVENOUS | Status: DC
  Administered 2014-04-14: 11:00:00 via INTRAVENOUS

## 2014-04-14 MED ORDER — SCOPOLAMINE 1 MG/3DAYS TD PT72
MEDICATED_PATCH | TRANSDERMAL | Status: DC
Start: 2014-04-14 — End: 2014-04-16
  Administered 2014-04-14: 1.5 mg via TRANSDERMAL
  Filled 2014-04-14: qty 1

## 2014-04-14 MED ORDER — KETOROLAC TROMETHAMINE 30 MG/ML IJ SOLN
30.0000 mg | Freq: Four times a day (QID) | INTRAMUSCULAR | Status: AC | PRN
  Administered 2014-04-14: 30 mg via INTRAVENOUS

## 2014-04-14 MED ORDER — FENTANYL CITRATE 0.05 MG/ML IJ SOLN
INTRAMUSCULAR | Status: DC | PRN
  Administered 2014-04-14: 25 ug via INTRATHECAL

## 2014-04-14 MED ORDER — TETANUS-DIPHTH-ACELL PERTUSSIS 5-2.5-18.5 LF-MCG/0.5 IM SUSP: 0.5000 mL | Freq: Once | INTRAMUSCULAR | Status: DC

## 2014-04-14 MED ORDER — LACTATED RINGERS IV SOLN
40.0000 [IU] | INTRAVENOUS | Status: DC | PRN
  Administered 2014-04-14: 40 [IU] via INTRAVENOUS

## 2014-04-14 MED ORDER — LACTATED RINGERS IV SOLN
INTRAVENOUS | Status: DC | PRN
  Administered 2014-04-14 (×2): via INTRAVENOUS

## 2014-04-14 MED ORDER — IBUPROFEN 600 MG PO TABS
600.0000 mg | ORAL_TABLET | Freq: Four times a day (QID) | ORAL | Status: DC
  Administered 2014-04-14 – 2014-04-16 (×8): 600 mg via ORAL
  Filled 2014-04-14 (×8): qty 1

## 2014-04-14 MED ORDER — PRENATAL MULTIVITAMIN CH
1.0000 | ORAL_TABLET | Freq: Every day | ORAL | Status: DC
  Administered 2014-04-15 – 2014-04-16 (×2): 1 via ORAL
  Filled 2014-04-14 (×2): qty 1

## 2014-04-14 MED ORDER — FENTANYL CITRATE 0.05 MG/ML IJ SOLN
INTRAMUSCULAR | Status: AC
  Administered 2014-04-14: 50 ug via INTRAVENOUS
  Filled 2014-04-14: qty 2

## 2014-04-14 MED ORDER — OXYTOCIN 10 UNIT/ML IJ SOLN
INTRAMUSCULAR | Status: AC
  Filled 2014-04-14: qty 4

## 2014-04-14 MED ORDER — CEFAZOLIN SODIUM-DEXTROSE 2-3 GM-% IV SOLR
2.0000 g | Freq: Once | INTRAVENOUS | Status: AC
  Administered 2014-04-14: 2 g via INTRAVENOUS
  Filled 2014-04-14: qty 50

## 2014-04-14 MED ORDER — DIBUCAINE 1 % RE OINT: 1.0000 "application " | TOPICAL_OINTMENT | RECTAL | Status: DC | PRN

## 2014-04-14 MED ORDER — MORPHINE SULFATE 0.5 MG/ML IJ SOLN
INTRAMUSCULAR | Status: AC
  Filled 2014-04-14: qty 10

## 2014-04-14 MED ORDER — DIPHENHYDRAMINE HCL 25 MG PO CAPS
25.0000 mg | ORAL_CAPSULE | ORAL | Status: DC | PRN
  Filled 2014-04-14: qty 1

## 2014-04-14 MED ORDER — ONDANSETRON HCL 4 MG/2ML IJ SOLN: 4.0000 mg | Freq: Three times a day (TID) | INTRAMUSCULAR | Status: DC | PRN

## 2014-04-14 MED ORDER — FENTANYL CITRATE 0.05 MG/ML IJ SOLN
INTRAMUSCULAR | Status: AC
  Filled 2014-04-14: qty 2

## 2014-04-14 MED ORDER — PHENYLEPHRINE HCL 10 MG/ML IJ SOLN
INTRAMUSCULAR | Status: DC | PRN
  Administered 2014-04-14: 80 ug via INTRAVENOUS

## 2014-04-14 MED ORDER — SIMETHICONE 80 MG PO CHEW: 80.0000 mg | CHEWABLE_TABLET | ORAL | Status: DC | PRN

## 2014-04-14 MED ORDER — DIPHENHYDRAMINE HCL 50 MG/ML IJ SOLN: 12.5000 mg | INTRAMUSCULAR | Status: DC | PRN

## 2014-04-14 MED ORDER — PHENYLEPHRINE HCL 10 MG/ML IJ SOLN
INTRAMUSCULAR | Status: AC
  Filled 2014-04-14: qty 1

## 2014-04-14 MED ORDER — ONDANSETRON HCL 4 MG/2ML IJ SOLN: 4.0000 mg | INTRAMUSCULAR | Status: DC | PRN

## 2014-04-14 MED ORDER — FENTANYL CITRATE 0.05 MG/ML IJ SOLN
25.0000 ug | INTRAMUSCULAR | Status: DC | PRN
  Administered 2014-04-14 (×2): 50 ug via INTRAVENOUS

## 2014-04-14 MED ORDER — SIMETHICONE 80 MG PO CHEW
80.0000 mg | CHEWABLE_TABLET | Freq: Three times a day (TID) | ORAL | Status: DC
  Administered 2014-04-14 – 2014-04-16 (×5): 80 mg via ORAL
  Filled 2014-04-14 (×5): qty 1

## 2014-04-14 MED ORDER — DIPHENHYDRAMINE HCL 25 MG PO CAPS: 25.0000 mg | ORAL_CAPSULE | Freq: Four times a day (QID) | ORAL | Status: DC | PRN

## 2014-04-14 MED ORDER — SCOPOLAMINE 1 MG/3DAYS TD PT72
1.0000 | MEDICATED_PATCH | Freq: Once | TRANSDERMAL | Status: DC
  Administered 2014-04-14: 1.5 mg via TRANSDERMAL

## 2014-04-14 MED ORDER — MENTHOL 3 MG MT LOZG: 1.0000 | LOZENGE | OROMUCOSAL | Status: DC | PRN

## 2014-04-14 MED ORDER — ONDANSETRON HCL 4 MG/2ML IJ SOLN
INTRAMUSCULAR | Status: AC
  Filled 2014-04-14: qty 2

## 2014-04-14 MED ORDER — DIPHENHYDRAMINE HCL 50 MG/ML IJ SOLN: 25.0000 mg | INTRAMUSCULAR | Status: DC | PRN

## 2014-04-14 MED ORDER — OXYCODONE-ACETAMINOPHEN 5-325 MG PO TABS
1.0000 | ORAL_TABLET | ORAL | Status: DC | PRN
  Administered 2014-04-14: 1 via ORAL
  Administered 2014-04-15 – 2014-04-16 (×7): 2 via ORAL
  Filled 2014-04-14 (×5): qty 2
  Filled 2014-04-14: qty 1
  Filled 2014-04-14 (×2): qty 2

## 2014-04-14 MED ORDER — SODIUM CHLORIDE 0.9 % IJ SOLN: 3.0000 mL | INTRAMUSCULAR | Status: DC | PRN

## 2014-04-14 MED ORDER — WITCH HAZEL-GLYCERIN EX PADS: 1.0000 "application " | MEDICATED_PAD | CUTANEOUS | Status: DC | PRN

## 2014-04-14 MED ORDER — ZOLPIDEM TARTRATE 5 MG PO TABS: 5.0000 mg | ORAL_TABLET | Freq: Every evening | ORAL | Status: DC | PRN

## 2014-04-14 MED ORDER — DEXTROSE 5 % IV SOLN
1.0000 ug/kg/h | INTRAVENOUS | Status: DC | PRN
  Filled 2014-04-14: qty 2

## 2014-04-14 MED ORDER — CITRIC ACID-SODIUM CITRATE 334-500 MG/5ML PO SOLN
ORAL | Status: AC
  Filled 2014-04-14: qty 15

## 2014-04-14 MED ORDER — LACTATED RINGERS IV SOLN
INTRAVENOUS | Status: DC
Start: 2014-04-14 — End: 2014-04-16
  Administered 2014-04-14: 20:00:00 via INTRAVENOUS

## 2014-04-14 MED ORDER — OXYTOCIN 40 UNITS IN LACTATED RINGERS INFUSION - SIMPLE MED: 62.5000 mL/h | INTRAVENOUS | Status: AC

## 2014-04-14 MED ORDER — ONDANSETRON HCL 4 MG PO TABS: 4.0000 mg | ORAL_TABLET | ORAL | Status: DC | PRN

## 2014-04-14 MED ORDER — KETOROLAC TROMETHAMINE 30 MG/ML IJ SOLN
INTRAMUSCULAR | Status: AC
  Administered 2014-04-14: 30 mg via INTRAVENOUS
  Filled 2014-04-14: qty 1

## 2014-04-14 MED ORDER — MORPHINE SULFATE (PF) 0.5 MG/ML IJ SOLN
INTRAMUSCULAR | Status: DC | PRN
  Administered 2014-04-14: .15 mg via INTRATHECAL

## 2014-04-14 MED ORDER — KETOROLAC TROMETHAMINE 30 MG/ML IJ SOLN: 30.0000 mg | Freq: Four times a day (QID) | INTRAMUSCULAR | Status: AC | PRN

## 2014-04-14 SURGICAL SUPPLY — 37 items
APL SKNCLS STERI-STRIP NONHPOA (GAUZE/BANDAGES/DRESSINGS) ×1
BENZOIN TINCTURE PRP APPL 2/3 (GAUZE/BANDAGES/DRESSINGS) ×2 IMPLANT
CLAMP CORD UMBIL (MISCELLANEOUS) IMPLANT
CLOSURE WOUND 1/2 X4 (GAUZE/BANDAGES/DRESSINGS) ×1
CLOTH BEACON ORANGE TIMEOUT ST (SAFETY) ×3 IMPLANT
DRAPE LG THREE QUARTER DISP (DRAPES) IMPLANT
DRSG OPSITE POSTOP 4X10 (GAUZE/BANDAGES/DRESSINGS) ×3 IMPLANT
DURAPREP 26ML APPLICATOR (WOUND CARE) ×3 IMPLANT
ELECT REM PT RETURN 9FT ADLT (ELECTROSURGICAL) ×3
ELECTRODE REM PT RTRN 9FT ADLT (ELECTROSURGICAL) ×1 IMPLANT
EXTRACTOR VACUUM KIWI (MISCELLANEOUS) IMPLANT
GLOVE BIO SURGEON STRL SZ 6.5 (GLOVE) ×2 IMPLANT
GLOVE BIO SURGEONS STRL SZ 6.5 (GLOVE) ×1
GOWN STRL REUS W/TWL LRG LVL3 (GOWN DISPOSABLE) ×6 IMPLANT
KIT ABG SYR 3ML LUER SLIP (SYRINGE) IMPLANT
NDL HYPO 25X5/8 SAFETYGLIDE (NEEDLE) IMPLANT
NEEDLE HYPO 25X5/8 SAFETYGLIDE (NEEDLE) IMPLANT
NS IRRIG 1000ML POUR BTL (IV SOLUTION) ×3 IMPLANT
PACK C SECTION WH (CUSTOM PROCEDURE TRAY) ×3 IMPLANT
PAD OB MATERNITY 4.3X12.25 (PERSONAL CARE ITEMS) ×3 IMPLANT
RTRCTR C-SECT PINK 25CM LRG (MISCELLANEOUS) ×3 IMPLANT
STRIP CLOSURE SKIN 1/2X4 (GAUZE/BANDAGES/DRESSINGS) ×1 IMPLANT
SUT CHROMIC 1 CTX 36 (SUTURE) ×6 IMPLANT
SUT PLAIN 0 NONE (SUTURE) IMPLANT
SUT PLAIN 2 0 XLH (SUTURE) ×3 IMPLANT
SUT VIC AB 0 CT1 27 (SUTURE) ×6
SUT VIC AB 0 CT1 27XBRD ANBCTR (SUTURE) ×2 IMPLANT
SUT VIC AB 2-0 CT1 27 (SUTURE) ×3
SUT VIC AB 2-0 CT1 TAPERPNT 27 (SUTURE) ×1 IMPLANT
SUT VIC AB 3-0 CT1 27 (SUTURE)
SUT VIC AB 3-0 CT1 TAPERPNT 27 (SUTURE) IMPLANT
SUT VIC AB 3-0 SH 27 (SUTURE) ×6
SUT VIC AB 3-0 SH 27X BRD (SUTURE) IMPLANT
SUT VIC AB 4-0 KS 27 (SUTURE) ×3 IMPLANT
TOWEL OR 17X24 6PK STRL BLUE (TOWEL DISPOSABLE) ×3 IMPLANT
TRAY FOLEY CATH 14FR (SET/KITS/TRAYS/PACK) ×3 IMPLANT
WATER STERILE IRR 1000ML POUR (IV SOLUTION) ×3 IMPLANT

## 2014-04-14 NOTE — MAU Note (Signed)
Pt scheduled to have repeat c/s on Tuesday July 14th. Pt states contractions that started at 10pm and are every 3 mins. Denies vag bleeding or LOF. +FM

## 2014-04-14 NOTE — MAU Note (Signed)
Per Dr Senaida Oresichardson, recheck pt in 1-2 hours. Pt may ambulate if she would like. If pt does not want to stay, she may be discharged home.

## 2014-04-14 NOTE — Transfer of Care (Signed)
Immediate Anesthesia Transfer of Care Note  Patient: Cheryl [name]  Procedure(s) Performed: Procedure(s): CESAREAN SECTION (N/A)  Patient Location: PACU  Anesthesia Type:Spinal  Level of Consciousness: awake, alert  and oriented  Airway & Oxygen Therapy: Patient Spontanous Breathing  Post-op Assessment: Report given to PACU RN and Post -op Vital signs reviewed and stable  Post vital signs: Reviewed and stable  Complications: No apparent anesthesia complications

## 2014-04-14 NOTE — Anesthesia Preprocedure Evaluation (Signed)
Anesthesia Evaluation  Patient identified by MRN, date of birth, ID band Patient awake    Reviewed: Allergy & Precautions, H&P , Patient's Chart, lab work & pertinent test results  Airway Mallampati: II  TM Distance: >3 FB Neck ROM: full    Dental no notable dental hx.    Pulmonary former smoker,  breath sounds clear to auscultation  Pulmonary exam normal       Cardiovascular Exercise Tolerance: Good Rhythm:regular Rate:Normal     Neuro/Psych    GI/Hepatic   Endo/Other    Renal/GU      Musculoskeletal   Abdominal   Peds  Hematology   Anesthesia Other Findings   Reproductive/Obstetrics                             Anesthesia Physical Anesthesia Plan  ASA: II  Anesthesia Plan: Spinal   Post-op Pain Management:    Induction:   Airway Management Planned:   Additional Equipment:   Intra-op Plan:   Post-operative Plan:   Informed Consent: I have reviewed the patients History and Physical, chart, labs and discussed the procedure including the risks, benefits and alternatives for the proposed anesthesia with the patient or authorized representative who has indicated his/her understanding and acceptance.   Dental Advisory Given  Plan Discussed with: CRNA  Anesthesia Plan Comments: (Lab work confirmed with CRNA in room. Platelets okay. Discussed spinal anesthetic, and patient consents to the procedure:  included risk of possible headache,backache, failed block, allergic reaction, and nerve injury. This patient was asked if she had any questions or concerns before the procedure started. )        Anesthesia Quick Evaluation  

## 2014-04-14 NOTE — Anesthesia Postprocedure Evaluation (Signed)
  Anesthesia Post-op Note  Patient: Cheryl FuelSarah B Dreese  Procedure(s) Performed: Procedure(s): CESAREAN SECTION (N/A)  Patient is awake, responsive, moving her legs, and has signs of resolution of her numbness. Pain and nausea are reasonably well controlled. Vital signs are stable and clinically acceptable. Oxygen saturation is clinically acceptable. There are no apparent anesthetic complications at this time. Patient is ready for discharge.

## 2014-04-14 NOTE — Consult Note (Signed)
Neonatology Note:   Attendance at C-section:    I was asked by Dr. Senaida Oresichardson to attend this repeat C/S at term done today due to onset of labor. The mother is a G2P1L0 O pos, GBS neg with bipolar disorder, depression, and a history of HSV (without lesion currently). ROM at delivery, fluid clear. Infant vigorous with good spontaneous cry and tone. Needed only minimal bulb suctioning. Ap 9/9. Lungs clear to ausc in DR. To CN to care of Pediatrician.   Doretha Souhristie C. Mateus Rewerts, MD

## 2014-04-14 NOTE — H&P (Signed)
Cheryl [name] is a 28 y.o. female G2P1001 at 39+ weeks (EDD 04/19/14 by LMP c/w first trimester US) presenting for contractions and cervical change.  Pt presented to MAU with painful contractions and changed her cervix from 1cm/50 to 2cm/80 over several hours of observation.  Contractions more painful.  Prenatal care complicated by prior c-section for active herpes outbreak, requested repeat and declines TOL.  Also with some depression, has managed without medications thus far.  Her first baby died at 575 months of age in an accident.    Maternal Medical History:  Reason for admission: Contractions.   Contractions: Onset was 6-12 hours ago.   Frequency: regular.   Perceived severity is moderate.    Fetal activity: Perceived fetal activity is normal.    Prenatal Complications - Diabetes: none.    OB History   Grav Para Term Preterm Abortions TAB SAB Ect Mult Living   2 1 1        0    2013 LTCS 8#8oz   Past Medical History  Diagnosis Date  . Asthma     as a child  . Abnormal Pap smear   . Depression   . PTSD (post-traumatic stress disorder)   . Bipolar 1 disorder   . HSV infection    Past Surgical History  Procedure Laterality Date  . Colposcopy w/ biopsy / curettage    . Cesarean section  04/30/2012    Procedure: CESAREAN SECTION;  Surgeon: Antionette CharLisa Jackson-Moore, MD;  Location: [facility];  Service: Gynecology;  Laterality: N/A;  Primary cesarean section of baby girl at 2113   APGAR  9/9   Family History: family history is negative for Other. Social History:  reports that she quit smoking about 3 years ago. She does not have any smokeless tobacco history on file. She reports that she does not drink alcohol or use illicit drugs.   Prenatal Transfer Tool  Maternal Diabetes: No Genetic Screening: Normal Maternal Ultrasounds/Referrals: Normal Fetal Ultrasounds or other Referrals:  None Maternal Substance Abuse:  No Significant Maternal Medications:  None Significant Maternal Lab  Results:  None Other Comments:  None  ROS  Dilation: 1 Effacement (%): 70 Station: -2 Exam by:: B Mosca Blood pressure 118/79, pulse 106, temperature 98.3 F (36.8 C), temperature source Oral, resp. rate 18, height 5\' 4"  (1.626 m), weight 89.994 kg (198 lb 6.4 oz), last menstrual period 07/13/2013. Maternal Exam:  Uterine Assessment: Contraction strength is moderate.  Contraction frequency is regular.   Abdomen: Patient reports no abdominal tenderness. Fetal presentation: vertex  Introitus: Normal vulva. Normal vagina.    Physical Exam  Constitutional: She is oriented to person, place, and time. She appears well-developed and well-nourished.  Cardiovascular: Normal rate.   Respiratory: Effort normal.  GI: Soft.  Old pfannensteil incision  Genitourinary: Vagina normal.  Neurological: She is alert and oriented to person, place, and time.  Psychiatric: She has a normal mood and affect.  uncomfortable    Prenatal labs: ABO, Rh: --/--/O POS (07/10 1510) Antibody: NEG (07/10 1506) Rubella:  Immune RPR: NON REAC (07/10 1506)  HBsAg:   NR HIV:   NR GBS:   Negative First trimester screen WNL AFP WNL One hour GTT 122  Assessment/Plan: Pt counseled on risks and benefits of repeat c-section including bleeding, infection, and possible damage to bowel and bladder.  Pt would accept blood transfusion if needed.  SHe is ready to proceed when OR ready.  Will give ancef for prophylaxis  Khan Chura W 04/14/2014, 10:31  AM     

## 2014-04-14 NOTE — MAU Note (Signed)
Anes, house coverage and adm nursery notified.

## 2014-04-14 NOTE — Anesthesia Procedure Notes (Signed)

## 2014-04-14 NOTE — Op Note (Signed)
Operative note  Preoperative diagnosis Term pregnancy at 39-2/7 weeks Labor Prior C-section declines trial of labor  Postoperative diagnosis Same  Procedure Repeat low transverse C-section with 2 layer closure of uterus  Surgeon Huel CoteKathy Jermel Artley  Anesthesia Spinal  Fluids Estimated blood loss 500 cc Urine output 200 cc clear IV fluid 2300 cc LR  Findings There were was a viable female infant in the vertex presentation Apgars were 8 and 9 weight pending at time of surgery. Uterus tubes and ovaries appeared normal.  Specimen Placenta sent to L&D  Procedure note  Patient was taken to the operating room where spinal anesthesia was found to be adequate by Allis clamp test. She was prepped and draped in the normal sterile fashion in the dorsal supine position with a leftward tilt. An appropriate time out was performed. A Pfannenstiel skin incision was then made through pre-existing scar with the scalpel and carried through to the underlying layer of fascia by sharp dissection and Bovie cautery. The fascia was nicked in the midline and the incision was extended laterally with Mayo scissors. The inferior aspect of the incision was grasped Coker clamps and dissected off the underlying rectus muscles. In a similar fashion the superior aspect was dissected off the rectus muscles. Rectus muscles were separated in the midline and the peritoneal cavity entered bluntly. The peritoneal incision was then extended both superiorly and inferiorly with careful attention to avoid both bowel and bladder. The Alexis self-retaining wound retractor was then placed within the incision and the lower uterine segment exposed. The bladder flap was developed with Metzenbaum scissors and pushed away from the lower uterine segment. The lower uterine segment was then incised in a transverse fashion and the cavity itself entered bluntly. The incision was extended bluntly. The infant's head was then lifted and delivered  from the incision without difficulty. The remainder of the infant delivered and the nose and mouth bulb suctioned with the cord clamped and cut as well. The infant was handed off to the waiting pediatricians. The placenta was then spontaneously expressed from the uterus and the uterus cleared of all clots and debris with moist lap sponge. The uterine incision was then repaired in 2 layers the first layer was a running locked layer 1-0 chromic and the second an imbricating layer of the same suture. An additional 3-0 Vicryl was utilized to close some denuded serosa on the right side of the uterus just superior to the incision. The tubes and ovaries were inspected and the gutters cleared of all clots and debris. The uterine incision was inspected and found to be hemostatic. All instruments and sponges as well as the Alexis retractor were then removed from the abdomen. The rectus muscles and peritoneum were then reapproximated with several interrupted mattress sutures of 2-0 Vicryl. The fascia was then closed with 0 Vicryl in a running fashion. Subcutaneous tissue was reapproximated with 3-0 plain in a running fashion. The skin was closed with a subcuticular stitch of 4-0 Vicryl on a Keith needle and then reinforced with benzoin and Steri-Strips. At the conclusion of the procedure all instruments and sponge counts were correct. Patient was taken to the recovery room in good condition with her baby accompanying her skin to skin.

## 2014-04-14 NOTE — Lactation Note (Signed)
This note was copied from the chart of Cheryl Eileen StanfordSarah Seivert. Lactation Consultation Note Initial visit at 10 hours of age.  Mom had a child that died at 945 months of age due to home accident.  This visit was not appropriate to question about previous breastfeeding experience.  Mom reports just giving baby bottle of colostrum, 5mls.  Mom has a piston type medela hand pump, but reports hand expression works better.  Mom says she just wants to give baby a little colostrum in the hospital until she is frustrated and will then give formula.   Mom is considering hand expressing every 3 hours, but doesn't think she will.  Encouraged mom to let us know how we can best support her.  Mom denies concerns at this time.  Follow up expected to be PRN.  Mom is aware of [facility] resources if she changes her mind.    Patient Name: Cheryl [name] [id number]'s Date: 04/14/2014 Reason for consult: Initial assessment   Maternal Data Has patient been taught Hand Expression?: Yes Does the patient have breastfeeding experience prior to this delivery?:  (Didn't ask at this time due to loss of newborn at 5months)  Feeding Feeding Type: Breast Milk  LATCH Score/Interventions                      Lactation Tools Discussed/Used     Consult Status Consult Status: PRN    Cheryl [name], Cheryl [name] 04/14/2014, 10:13 PM

## 2014-04-15 ENCOUNTER — Encounter (HOSPITAL_COMMUNITY): Payer: Self-pay | Admitting: Obstetrics and Gynecology

## 2014-04-15 ENCOUNTER — Encounter (HOSPITAL_COMMUNITY): Payer: Self-pay | Admitting: Anesthesiology

## 2014-04-15 LAB — CBC
HEMATOCRIT: 31.4 % — AB (ref 36.0–46.0)
Hemoglobin: 10.1 g/dL — ABNORMAL LOW (ref 12.0–15.0)
MCH: 26.6 pg (ref 26.0–34.0)
MCHC: 32.2 g/dL (ref 30.0–36.0)
MCV: 82.6 fL (ref 78.0–100.0)
Platelets: 234 10*3/uL (ref 150–400)
RBC: 3.8 MIL/uL — ABNORMAL LOW (ref 3.87–5.11)
RDW: 14 % (ref 11.5–15.5)
WBC: 13.5 10*3/uL — ABNORMAL HIGH (ref 4.0–10.5)

## 2014-04-15 NOTE — Progress Notes (Signed)
Ur chart review completed.  

## 2014-04-15 NOTE — Lactation Note (Addendum)
This note was copied from the chart of Cheryl Cheryl [name]. Lactation Consultation Note  Patient Name: Cheryl [name] [id number]'s Date: 04/15/2014 Reason for consult: Follow-up assessment (per mom plans to formula feed only ) Per mom started out breast feeding , but now plans to only formula feed. Per   MBU RN Vivi MartensAshley Billings had discussed with mom this am supply and demand.  Discussed how to dry milk up with cold cabbage consistently.   Maternal Data Formula Feeding for Exclusion: No  Feeding    LATCH Score/Interventions                      Lactation Tools Discussed/Used     Consult Status Consult Status: Complete    Kathrin Greathouseorio, Ravan Schlemmer Ann 04/15/2014, 1:42 PM

## 2014-04-15 NOTE — Progress Notes (Signed)
Subjective: Postpartum Day 1: Cesarean Delivery Patient reports tolerating PO, + flatus and no problems voiding.    Objective: Vital signs in last 24 hours: Temp:  [97.4 F (36.3 C)-99 F (37.2 C)] 98.1 F (36.7 C) (07/13 0430) Pulse Rate:  [75-104] 75 (07/13 0430) Resp:  [18-20] 20 (07/13 0430) BP: (91-121)/(53-77) 97/61 mmHg (07/13 0430) SpO2:  [97 %-99 %] 97 % (07/13 0430)  Physical Exam:  General: alert and cooperative Lochia: appropriate Uterine Fundus: firm Incision: C/D/I    Recent Labs  04/12/14 1506 04/15/14 0550  HGB 10.9* 10.1*  HCT 33.8* 31.4*    Assessment/Plan: Status post Cesarean section. Doing well postoperatively.  Continue current care.  Cheryl [name],Cheryl [name] 04/15/2014, 8:22 AM

## 2014-04-16 ENCOUNTER — Inpatient Hospital Stay (HOSPITAL_COMMUNITY)
Admission: RE | Admit: 2014-04-16 | Payer: Medicaid Other | Source: Ambulatory Visit | Admitting: Obstetrics and Gynecology

## 2014-04-16 ENCOUNTER — Encounter (HOSPITAL_COMMUNITY): Admission: RE | Payer: Self-pay | Source: Ambulatory Visit

## 2014-04-16 SURGERY — Surgical Case
Anesthesia: Regional

## 2014-04-16 MED ORDER — OXYCODONE-ACETAMINOPHEN 5-325 MG PO TABS: 1.0000 | ORAL_TABLET | ORAL | Status: DC | PRN

## 2014-04-16 MED ORDER — IBUPROFEN 600 MG PO TABS: 600.0000 mg | ORAL_TABLET | Freq: Four times a day (QID) | ORAL | Status: DC

## 2014-04-16 NOTE — Discharge Summary (Signed)
Obstetric Discharge Summary Reason for Admission: onset of labor Prenatal Procedures: none Intrapartum Procedures: cesarean: low cervical, transverse Postpartum Procedures: none Complications-Operative and Postpartum: none Hemoglobin  Date Value Ref Range Status  04/15/2014 10.1* 12.0 - 15.0 g/dL Final     HCT  Date Value Ref Range Status  04/15/2014 31.4* 36.0 - 46.0 % Final    Physical Exam:  General: alert and cooperative Lochia: appropriate Uterine Fundus: firm Incision: C/D/I   Discharge Diagnoses: Term Pregnancy-delivered  Discharge Information: Date: 04/16/2014 Activity: pelvic rest Diet: routine Medications: Ibuprofen and Percocet Condition: improved Instructions: refer to practice specific booklet Discharge to: home Follow-up Information   Follow up with [name],Teyona Nichelson W, MD In 2 weeks. (incision check)    Specialty:  Obstetrics and Gynecology   Contact information:   [address] [place] [address] [phone]       Newborn Data: Live born female  Birth Weight: 7 lb 8.5 oz (3415 g) APGAR: 9, 9  Home with mother.  Oliver PilaRICHARDSON,Lulani Bour W 04/16/2014, 8:52 AM

## 2014-04-16 NOTE — Progress Notes (Signed)
Clinical Social Work Department PSYCHOSOCIAL ASSESSMENT - MATERNAL/CHILD 04/16/2014  Patient:  Cheryl [name]  Account Number:  [id number]  Admit Date:  04/14/2014  Childs Name:   Cheryl Whitaker Jr.    Clinical Social Worker:  Cheryl Witkop, LCSW   Date/Time:  04/16/2014 09:00 AM  Date Referred:  04/16/2014   Referral source  CN     Referred reason  Behavioral Health Issues   Other referral source:    I:  FAMILY / HOME ENVIRONMENT Child's legal guardian:  PARENT  Guardian - Name Guardian - Age Guardian - Address  Cheryl [name] [address] [place], [place] [address]  Cheryl Whitaker  same   Other household support members/support persons Other support:   Parents report having a great support system.    II  PSYCHOSOCIAL DATA Information Source:  Family Interview  Financial and Community Resources Employment:   Financial resources:  Medicaid If Medicaid - County:  [place]  School / Grade:   Maternity Care Coordinator / Child Services Coordination / Early Interventions:  Cultural issues impacting care:   None stated    III  STRENGTHS Strengths  Adequate Resources  Compliance with medical plan  Home prepared for Child (including basic supplies)  Supportive family/friends  Other - See comment   Strength comment:  Pediatric follow up will be with Dr. Davis at [facility]   IV  RISK FACTORS AND CURRENT PROBLEMS Current Problem:  YES   Risk Factor & Current Problem Patient Issue Family Issue Risk Factor / Current Problem Comment  Mental Illness Y N PTSD, Bipolar   N N     V  SOCIAL WORK ASSESSMENT  CSW met with parents in [name]'s first floor room to complete assessment for PTSD and hx of Bipolar.  They were pleasant and welcomed CSW into the room.  They seemed open to talk with CSW and open about losing their daughter at 4 months old from suffocation from a plastic bag that was in reach of her crib.  Parents state they accessed grief services through  [facility], but did not attend the program for long.  [name] states her counselor was Cheryl [name]/Social Worker.  [name] states she perfers to talk with her natural support people.  [name] states she also has a dx of Bipolar.  She sees a psychiatrist at the [facility] and states she is prescribed Celexa.  She plans to continue this medication and feels comfortable talking with her doctor if she feels she needs to make changes to her medication.  CSW discussed signs and symptoms of PPD as well as how, even though baby's birth is a happy time, baby's birth may cause triggers ot PTSD.  CSW states this may be especially true of the time when baby approaches the age their daughter was when she passed away.  Parents were very open to talking about this and state they have discussed this.  They state this was a planned pregnancy.  FOB was very quiet during the assessment.  [name] states she has someone named Cheryl [name], whom she thinks is a nurse from the [employer], checking on her.  She states this is a good support.  She states she will call her psychiatrist, whose name she cannot recall at this time, or her OB if she has emotional concerns at any time.  Parents are accepting of CSW's recommendation to make a CC4C referral.  CSW feels parents are coping well at this time and have good natural supports in place.    They have the information for professional supports if needed and state they will access professional supports if needed.  CSW identifies no barriers to discharge.     VI SOCIAL WORK PLAN Social Work Plan  Patient/Family Education  No Further Intervention Required / No Barriers to Discharge   Type of pt/family education:   PPD signs and symptoms  PTSD triggers/importance of mental health care   If child protective services report - county:   If child protective services report - date:   Information/referral to community resources comment:   CC4C   Other social work plan:    

## 2014-04-16 NOTE — Progress Notes (Signed)
CSW assessment completed.  Full documentation to follow.  No barriers to discharge.

## 2014-04-16 NOTE — Progress Notes (Signed)
Subjective: Postpartum Day 2 Cesarean Delivery Patient reports tolerating PO and no problems voiding.    Objective: Vital signs in last 24 hours: Temp:  [97.5 F (36.4 C)-97.9 F (36.6 C)] 97.9 F (36.6 C) (07/14 0513) Pulse Rate:  [72-73] 72 (07/14 0513) Resp:  [18] 18 (07/14 0513) BP: (91-94)/(59-62) 91/59 mmHg (07/14 0513)  Physical Exam:  General: alert and cooperative Lochia: appropriate Uterine Fundus: firm    Recent Labs  04/15/14 0550  HGB 10.1*  HCT 31.4*    Assessment/Plan: Status post Cesarean section. Doing well postoperatively.  Discharge home with standard precautions and return to clinic in 2 weeks.  Oliver PilaRICHARDSON,Aaditya Letizia W 04/16/2014, 8:51 AM

## 2014-04-17 ENCOUNTER — Emergency Department (HOSPITAL_COMMUNITY)
Admission: EM | Admit: 2014-04-17 | Discharge: 2014-04-17 | Disposition: A | Discharge status: Home / Self Care | Payer: Medicaid Other

## 2014-04-17 NOTE — ED Notes (Signed)
Pt c/o bilateral lower leg swelling  Pt states she does not want to wait here to be seen and states she will follow up with her dr in the morning  Pt's blood pressure 122/73  Pt denies shortness of breath or any other sxs  Encouraged to stay but pt declined

## 2014-08-05 ENCOUNTER — Encounter (HOSPITAL_COMMUNITY): Payer: Self-pay | Admitting: Obstetrics and Gynecology

## 2015-06-10 LAB — OB RESULTS CONSOLE HEPATITIS B SURFACE ANTIGEN: HEP B S AG: NEGATIVE

## 2015-06-10 LAB — OB RESULTS CONSOLE RUBELLA ANTIBODY, IGM: RUBELLA: IMMUNE

## 2015-06-10 LAB — OB RESULTS CONSOLE ABO/RH: RH TYPE: POSITIVE

## 2015-06-10 LAB — OB RESULTS CONSOLE ANTIBODY SCREEN: ANTIBODY SCREEN: NEGATIVE

## 2015-07-22 LAB — OB RESULTS CONSOLE GC/CHLAMYDIA
CHLAMYDIA, DNA PROBE: NEGATIVE
Gonorrhea: NEGATIVE

## 2015-07-22 LAB — OB RESULTS CONSOLE HIV ANTIBODY (ROUTINE TESTING): HIV: NONREACTIVE

## 2015-07-22 LAB — OB RESULTS CONSOLE RPR: RPR: NONREACTIVE

## 2015-09-12 LAB — OB RESULTS CONSOLE GBS: GBS: NEGATIVE

## 2015-10-13 ENCOUNTER — Encounter (HOSPITAL_COMMUNITY): Payer: Self-pay

## 2015-10-13 NOTE — Patient Instructions (Signed)
Your procedure is scheduled on: Thursday, Jan. 12, 2017  Enter through the Main [facility]tr[facility] of [facility] at:  6:00 A.M.  Pick up the phone at the desk and dial [phone].  Call this number if you have problems the morning of surgery: [phone].  Remember: Do NOT eat food or drink after:  Midnight Wednesday Take these medicines the morning of surgery with a SIP OF WATER:  None  Do NOT wear jewelry (body piercing), metal hair clips/bobby pins, or nail polish. Do NOT wear lotions, powders, or perfumes.  You may wear deoderant. Do NOT shave for 48 hours prior to surgery. Do NOT bring valuables to the hospital.  Leave suitcase in car.  After surgery it may be brought to your room.  For patients admitted to the hospital, checkout time is 11:00 AM the day of discharge.

## 2015-10-14 ENCOUNTER — Encounter (HOSPITAL_COMMUNITY)
Admission: RE | Admit: 2015-10-14 | Discharge: 2015-10-14 | Disposition: A | Discharge status: Home / Self Care | Payer: Medicaid Other | Source: Ambulatory Visit | Attending: Obstetrics and Gynecology | Admitting: Obstetrics and Gynecology

## 2015-10-14 ENCOUNTER — Encounter (HOSPITAL_COMMUNITY): Payer: Self-pay

## 2015-10-14 DIAGNOSIS — Z98891 History of uterine scar from previous surgery: Secondary | ICD-10-CM | POA: Insufficient documentation

## 2015-10-14 DIAGNOSIS — Z3A39 39 weeks gestation of pregnancy: Secondary | ICD-10-CM | POA: Diagnosis not present

## 2015-10-14 DIAGNOSIS — Z01812 Encounter for preprocedural laboratory examination: Secondary | ICD-10-CM | POA: Diagnosis not present

## 2015-10-14 HISTORY — DX: Headache: R51

## 2015-10-14 HISTORY — DX: Headache, unspecified: R51.9

## 2015-10-14 HISTORY — DX: Gastro-esophageal reflux disease without esophagitis: K21.9

## 2015-10-14 HISTORY — DX: Unspecified osteoarthritis, unspecified site: M19.90

## 2015-10-14 LAB — CBC
HEMATOCRIT: 37.3 % (ref 36.0–46.0)
Hemoglobin: 12 g/dL (ref 12.0–15.0)
MCH: 28.3 pg (ref 26.0–34.0)
MCHC: 32.2 g/dL (ref 30.0–36.0)
MCV: 88 fL (ref 78.0–100.0)
Platelets: 364 10*3/uL (ref 150–400)
RBC: 4.24 MIL/uL (ref 3.87–5.11)
RDW: 13.6 % (ref 11.5–15.5)
WBC: 8.7 10*3/uL (ref 4.0–10.5)

## 2015-10-14 LAB — TYPE AND SCREEN
ABO/RH(D): O POS
Antibody Screen: NEGATIVE

## 2015-10-15 LAB — RPR: RPR: NONREACTIVE

## 2015-10-15 NOTE — H&P (Signed)
Cheryl [name] is a 30 y.o. female G3P2002 at 39+ for rLTCS and BTL by B salpingectomy.  Relatively uncomplicated PNC except late entry to care and HSV.  Pt presents to care at about 18 weeks.  [place] dates pregnancy.  PT with HSV decline supression.  D/w pt r/b/a of rLTCS and BTL - voices understanding, wishes to proceed. +FM, no LOF, no VB, occ Ctx.    Maternal Medical History:  Contractions: Frequency: irregular.   Perceived severity is moderate.    Fetal activity: Perceived fetal activity is normal.    Prenatal Complications - Diabetes: none.    OB History    Gravida Para Term Preterm AB TAB SAB Ectopic Multiple Living   3 2 2       1     G1 HSV outbreak, LTCS 8#8 female G2 rLTCS 7#8 female G3 present  H/o abn pap, nl since - last 10/18/13 H/o HSV, trich   Past Medical History  Diagnosis Date  . Asthma     as a child  . Abnormal Pap smear   . Depression   . PTSD (post-traumatic stress disorder)   . Bipolar 1 disorder (HCC)   . HSV infection   . GERD (gastroesophageal reflux disease)     with pregnancy  . Headache   . Arthritis     bilateral jaws   Past Surgical History  Procedure Laterality Date  . Colposcopy w/ biopsy / curettage    . Cesarean section  04/30/2012    Procedure: CESAREAN SECTION;  Surgeon: Cheryl Char, MD;  Location: [facility];  Service: Gynecology;  Laterality: N/A;  Primary cesarean section of baby girl at 2113   APGAR  9/9  . Cesarean section N/A 04/14/2014    Procedure: CESAREAN SECTION;  Surgeon: Cheryl Pila, MD;  Location: [facility];  Service: Obstetrics;  Laterality: N/A;  . Wisdom tooth extraction     Family History: family history is negative for Other. Social History:  reports that she quit smoking about 4 years ago. Her smoking use included Cigarettes. She has a 3 pack-year smoking history. She has never used smokeless tobacco. She reports that she drinks alcohol. She reports that she does not use illicit drugs.single, has PFA Meds  PNV All NKDA   Prenatal Transfer Tool  Maternal Diabetes: No Genetic Screening: Normal Maternal Ultrasounds/Referrals: Normal Fetal Ultrasounds or other Referrals:  None Maternal Substance Abuse:  No Significant Maternal Medications:  None Significant Maternal Lab Results:  Lab values include: Group B Strep negative Other Comments:  None  Review of Systems  Constitutional: Negative.   HENT: Negative.   Eyes: Negative.   Respiratory: Negative.   Cardiovascular: Negative.   Gastrointestinal: Negative.   Genitourinary: Negative.   Musculoskeletal: Negative.   Skin: Negative.   Neurological: Negative.   Psychiatric/Behavioral: Negative.       Last menstrual period 01/10/2015, unknown if currently breastfeeding. Maternal Exam:  Uterine Assessment: Contraction frequency is irregular.   Abdomen: Patient reports no abdominal tenderness. Surgical scars: low transverse.   Fundal height is appropriate for gestation.   Estimated fetal weight is 8-9#.   Fetal presentation: vertex  Introitus: Normal vulva. Normal vagina.  Cervix: Cervix evaluated by digital exam.     Physical Exam  Constitutional: She is oriented to person, place, and time. She appears well-developed and well-nourished.  HENT:  Head: Normocephalic and atraumatic.  Cardiovascular: Normal rate and regular rhythm.   Respiratory: Effort normal and breath sounds normal. No respiratory distress. She has  no wheezes.  GI: Soft. Bowel sounds are normal. She exhibits no distension. There is no tenderness.  Musculoskeletal: Normal range of motion.  Neurological: She is alert and oriented to person, place, and time.  Skin: Skin is warm and dry.  Psychiatric: She has a normal mood and affect. Her behavior is normal.    Prenatal labs: ABO, Rh: --/--/O POS (01/10 1200) Antibody: NEG (01/10 1200) Rubella: Immune (09/06 0000) RPR: Non Reactive (01/10 1200)  HBsAg: Negative (09/06 0000)  HIV: Non-reactive (10/18 0000)   GBS: Negative (12/09 0000)  Hgb 11.8/Plt 243K/Ur Cx neg/GC neg/ Chl neg/AFP WNL/glucola 85  18wk US dates preg, nl anat, fundal plac, female  Assessment/Plan: 17XW 29 G3P2002 for rLTCS and BTL at 39+ D/w pt r/b/a of procedure Ancef for prophylaxis    [name], Cheryl [name] 10/15/2015, 9:02 AM

## 2015-10-16 ENCOUNTER — Inpatient Hospital Stay (HOSPITAL_COMMUNITY): Payer: Medicaid Other | Admitting: Anesthesiology

## 2015-10-16 ENCOUNTER — Inpatient Hospital Stay (HOSPITAL_COMMUNITY)
Admission: RE | Admit: 2015-10-16 | Discharge: 2015-10-19 | DRG: 766 | Disposition: A | Discharge status: Home / Self Care | Payer: Medicaid Other | Source: Ambulatory Visit | Attending: Obstetrics and Gynecology | Admitting: Obstetrics and Gynecology

## 2015-10-16 ENCOUNTER — Encounter (HOSPITAL_COMMUNITY): Payer: Self-pay | Admitting: Anesthesiology

## 2015-10-16 ENCOUNTER — Encounter (HOSPITAL_COMMUNITY)
Admission: RE | Disposition: A | Discharge status: Home / Self Care | Payer: Self-pay | Source: Ambulatory Visit | Attending: Obstetrics and Gynecology

## 2015-10-16 DIAGNOSIS — O34211 Maternal care for low transverse scar from previous cesarean delivery: Secondary | ICD-10-CM | POA: Diagnosis present

## 2015-10-16 DIAGNOSIS — F319 Bipolar disorder, unspecified: Secondary | ICD-10-CM | POA: Diagnosis present

## 2015-10-16 DIAGNOSIS — Z302 Encounter for sterilization: Secondary | ICD-10-CM

## 2015-10-16 DIAGNOSIS — J45909 Unspecified asthma, uncomplicated: Secondary | ICD-10-CM | POA: Diagnosis present

## 2015-10-16 DIAGNOSIS — Z87891 Personal history of nicotine dependence: Secondary | ICD-10-CM

## 2015-10-16 DIAGNOSIS — Z3A39 39 weeks gestation of pregnancy: Secondary | ICD-10-CM

## 2015-10-16 DIAGNOSIS — Z98891 History of uterine scar from previous surgery: Secondary | ICD-10-CM

## 2015-10-16 DIAGNOSIS — K219 Gastro-esophageal reflux disease without esophagitis: Secondary | ICD-10-CM | POA: Diagnosis present

## 2015-10-16 DIAGNOSIS — O9952 Diseases of the respiratory system complicating childbirth: Secondary | ICD-10-CM | POA: Diagnosis present

## 2015-10-16 DIAGNOSIS — O99344 Other mental disorders complicating childbirth: Secondary | ICD-10-CM | POA: Diagnosis present

## 2015-10-16 HISTORY — PX: TUBAL LIGATION: SHX77

## 2015-10-16 SURGERY — Surgical Case
Anesthesia: Spinal

## 2015-10-16 MED ORDER — PHENYLEPHRINE 8 MG IN D5W 100 ML (0.08MG/ML) PREMIX OPTIME
INJECTION | INTRAVENOUS | Status: DC | PRN
  Administered 2015-10-16: 80 ug/min via INTRAVENOUS

## 2015-10-16 MED ORDER — LACTATED RINGERS IV SOLN
INTRAVENOUS | Status: DC
  Administered 2015-10-16: 07:00:00 via INTRAVENOUS

## 2015-10-16 MED ORDER — SCOPOLAMINE 1 MG/3DAYS TD PT72
1.0000 | MEDICATED_PATCH | Freq: Once | TRANSDERMAL | Status: DC
  Administered 2015-10-16: 1.5 mg via TRANSDERMAL

## 2015-10-16 MED ORDER — ACETAMINOPHEN 500 MG PO TABS: 1000.0000 mg | ORAL_TABLET | Freq: Four times a day (QID) | ORAL | Status: DC

## 2015-10-16 MED ORDER — NALOXONE HCL 2 MG/2ML IJ SOSY
1.0000 ug/kg/h | PREFILLED_SYRINGE | INTRAVENOUS | Status: DC | PRN
  Filled 2015-10-16: qty 2

## 2015-10-16 MED ORDER — SIMETHICONE 80 MG PO CHEW
80.0000 mg | CHEWABLE_TABLET | ORAL | Status: DC
  Administered 2015-10-16 – 2015-10-19 (×3): 80 mg via ORAL
  Filled 2015-10-16 (×3): qty 1

## 2015-10-16 MED ORDER — DIPHENHYDRAMINE HCL 25 MG PO CAPS: 25.0000 mg | ORAL_CAPSULE | ORAL | Status: DC | PRN

## 2015-10-16 MED ORDER — SIMETHICONE 80 MG PO CHEW
80.0000 mg | CHEWABLE_TABLET | Freq: Three times a day (TID) | ORAL | Status: DC
  Administered 2015-10-16 – 2015-10-19 (×8): 80 mg via ORAL
  Filled 2015-10-16 (×9): qty 1

## 2015-10-16 MED ORDER — MORPHINE SULFATE (PF) 0.5 MG/ML IJ SOLN
INTRAMUSCULAR | Status: DC | PRN
  Administered 2015-10-16: .2 mg via INTRATHECAL

## 2015-10-16 MED ORDER — PROMETHAZINE HCL 25 MG/ML IJ SOLN: 6.2500 mg | INTRAMUSCULAR | Status: DC | PRN

## 2015-10-16 MED ORDER — SODIUM CHLORIDE 0.9 % IJ SOLN: 3.0000 mL | INTRAMUSCULAR | Status: DC | PRN

## 2015-10-16 MED ORDER — KETOROLAC TROMETHAMINE 30 MG/ML IJ SOLN
INTRAMUSCULAR | Status: AC
  Filled 2015-10-16: qty 1

## 2015-10-16 MED ORDER — MENTHOL 3 MG MT LOZG: 1.0000 | LOZENGE | OROMUCOSAL | Status: DC | PRN

## 2015-10-16 MED ORDER — NALOXONE HCL 0.4 MG/ML IJ SOLN: 0.4000 mg | INTRAMUSCULAR | Status: DC | PRN

## 2015-10-16 MED ORDER — ZOLPIDEM TARTRATE 5 MG PO TABS: 5.0000 mg | ORAL_TABLET | Freq: Every evening | ORAL | Status: DC | PRN

## 2015-10-16 MED ORDER — DIPHENHYDRAMINE HCL 50 MG/ML IJ SOLN: 12.5000 mg | INTRAMUSCULAR | Status: DC | PRN

## 2015-10-16 MED ORDER — LACTATED RINGERS IV SOLN
INTRAVENOUS | Status: DC
  Administered 2015-10-16: 15:00:00 via INTRAVENOUS

## 2015-10-16 MED ORDER — TETANUS-DIPHTH-ACELL PERTUSSIS 5-2.5-18.5 LF-MCG/0.5 IM SUSP: 0.5000 mL | Freq: Once | INTRAMUSCULAR | Status: DC

## 2015-10-16 MED ORDER — MEPERIDINE HCL 25 MG/ML IJ SOLN: 6.2500 mg | INTRAMUSCULAR | Status: DC | PRN

## 2015-10-16 MED ORDER — CEFAZOLIN SODIUM-DEXTROSE 2-3 GM-% IV SOLR
INTRAVENOUS | Status: AC
  Filled 2015-10-16: qty 50

## 2015-10-16 MED ORDER — FENTANYL CITRATE (PF) 100 MCG/2ML IJ SOLN
INTRAMUSCULAR | Status: AC
  Filled 2015-10-16: qty 2

## 2015-10-16 MED ORDER — ACETAMINOPHEN 325 MG PO TABS
650.0000 mg | ORAL_TABLET | ORAL | Status: DC | PRN
Start: 2015-10-16 — End: 2015-10-19
  Administered 2015-10-16: 650 mg via ORAL
  Filled 2015-10-16: qty 2

## 2015-10-16 MED ORDER — NALBUPHINE HCL 10 MG/ML IJ SOLN: 5.0000 mg | INTRAMUSCULAR | Status: DC | PRN

## 2015-10-16 MED ORDER — KETOROLAC TROMETHAMINE 30 MG/ML IJ SOLN
30.0000 mg | Freq: Four times a day (QID) | INTRAMUSCULAR | Status: AC | PRN
  Administered 2015-10-16: 30 mg via INTRAMUSCULAR

## 2015-10-16 MED ORDER — PRENATAL MULTIVITAMIN CH
1.0000 | ORAL_TABLET | Freq: Every day | ORAL | Status: DC
  Administered 2015-10-17 – 2015-10-18 (×2): 1 via ORAL
  Filled 2015-10-16 (×2): qty 1

## 2015-10-16 MED ORDER — MORPHINE SULFATE (PF) 0.5 MG/ML IJ SOLN
INTRAMUSCULAR | Status: AC
  Filled 2015-10-16: qty 10

## 2015-10-16 MED ORDER — SIMETHICONE 80 MG PO CHEW: 80.0000 mg | CHEWABLE_TABLET | ORAL | Status: DC | PRN

## 2015-10-16 MED ORDER — SCOPOLAMINE 1 MG/3DAYS TD PT72
1.0000 | MEDICATED_PATCH | Freq: Once | TRANSDERMAL | Status: DC
  Filled 2015-10-16: qty 1

## 2015-10-16 MED ORDER — DIBUCAINE 1 % RE OINT: 1.0000 "application " | TOPICAL_OINTMENT | RECTAL | Status: DC | PRN

## 2015-10-16 MED ORDER — LACTATED RINGERS IV SOLN
INTRAVENOUS | Status: DC | PRN
  Administered 2015-10-16 (×2): via INTRAVENOUS

## 2015-10-16 MED ORDER — NALBUPHINE HCL 10 MG/ML IJ SOLN: 5.0000 mg | Freq: Once | INTRAMUSCULAR | Status: DC | PRN

## 2015-10-16 MED ORDER — WITCH HAZEL-GLYCERIN EX PADS: 1.0000 "application " | MEDICATED_PAD | CUTANEOUS | Status: DC | PRN

## 2015-10-16 MED ORDER — FENTANYL CITRATE (PF) 100 MCG/2ML IJ SOLN
INTRAMUSCULAR | Status: DC | PRN
  Administered 2015-10-16: 12.5 ug via INTRATHECAL

## 2015-10-16 MED ORDER — MEPERIDINE HCL 25 MG/ML IJ SOLN
INTRAMUSCULAR | Status: AC
  Filled 2015-10-16: qty 1

## 2015-10-16 MED ORDER — ONDANSETRON HCL 4 MG/2ML IJ SOLN
INTRAMUSCULAR | Status: DC | PRN
  Administered 2015-10-16: 4 mg via INTRAVENOUS

## 2015-10-16 MED ORDER — OXYCODONE-ACETAMINOPHEN 5-325 MG PO TABS
1.0000 | ORAL_TABLET | ORAL | Status: DC | PRN
  Administered 2015-10-16 – 2015-10-19 (×12): 2 via ORAL
  Filled 2015-10-16 (×12): qty 2

## 2015-10-16 MED ORDER — LANOLIN HYDROUS EX OINT: 1.0000 "application " | TOPICAL_OINTMENT | CUTANEOUS | Status: DC | PRN

## 2015-10-16 MED ORDER — KETOROLAC TROMETHAMINE 30 MG/ML IJ SOLN: 30.0000 mg | Freq: Four times a day (QID) | INTRAMUSCULAR | Status: DC

## 2015-10-16 MED ORDER — NALBUPHINE HCL 10 MG/ML IJ SOLN
INTRAMUSCULAR | Status: AC
  Filled 2015-10-16: qty 1

## 2015-10-16 MED ORDER — LACTATED RINGERS IV SOLN: 2.5000 [IU]/h | INTRAVENOUS | Status: AC

## 2015-10-16 MED ORDER — HYDROMORPHONE HCL 1 MG/ML IJ SOLN: 0.2500 mg | INTRAMUSCULAR | Status: DC | PRN

## 2015-10-16 MED ORDER — OXYTOCIN 10 UNIT/ML IJ SOLN
INTRAMUSCULAR | Status: AC
  Filled 2015-10-16: qty 4

## 2015-10-16 MED ORDER — CEFAZOLIN SODIUM-DEXTROSE 2-3 GM-% IV SOLR
2.0000 g | INTRAVENOUS | Status: AC
  Administered 2015-10-16: 2 g via INTRAVENOUS

## 2015-10-16 MED ORDER — IBUPROFEN 600 MG PO TABS: 600.0000 mg | ORAL_TABLET | Freq: Four times a day (QID) | ORAL | Status: DC | PRN

## 2015-10-16 MED ORDER — KETOROLAC TROMETHAMINE 30 MG/ML IJ SOLN
30.0000 mg | Freq: Four times a day (QID) | INTRAMUSCULAR | Status: AC | PRN
  Administered 2015-10-16: 30 mg via INTRAVENOUS
  Filled 2015-10-16: qty 1

## 2015-10-16 MED ORDER — BUPIVACAINE IN DEXTROSE 0.75-8.25 % IT SOLN
INTRATHECAL | Status: DC | PRN
  Administered 2015-10-16: 1.4 mL via INTRATHECAL

## 2015-10-16 MED ORDER — SCOPOLAMINE 1 MG/3DAYS TD PT72
MEDICATED_PATCH | TRANSDERMAL | Status: AC
  Administered 2015-10-16: 1.5 mg via TRANSDERMAL
  Filled 2015-10-16: qty 1

## 2015-10-16 MED ORDER — OXYTOCIN 10 UNIT/ML IJ SOLN
40.0000 [IU] | INTRAVENOUS | Status: DC | PRN
  Administered 2015-10-16: 40 [IU] via INTRAVENOUS

## 2015-10-16 MED ORDER — LACTATED RINGERS IV SOLN
INTRAVENOUS | Status: DC | PRN
  Administered 2015-10-16: 08:00:00 via INTRAVENOUS

## 2015-10-16 MED ORDER — SENNOSIDES-DOCUSATE SODIUM 8.6-50 MG PO TABS
2.0000 | ORAL_TABLET | ORAL | Status: DC
  Administered 2015-10-16 – 2015-10-19 (×3): 2 via ORAL
  Filled 2015-10-16 (×3): qty 2

## 2015-10-16 MED ORDER — ONDANSETRON HCL 4 MG/2ML IJ SOLN: 4.0000 mg | Freq: Three times a day (TID) | INTRAMUSCULAR | Status: DC | PRN

## 2015-10-16 MED ORDER — IBUPROFEN 800 MG PO TABS
800.0000 mg | ORAL_TABLET | Freq: Three times a day (TID) | ORAL | Status: DC
  Administered 2015-10-16 – 2015-10-19 (×8): 800 mg via ORAL
  Filled 2015-10-16 (×8): qty 1

## 2015-10-16 MED ORDER — LACTATED RINGERS IV SOLN: INTRAVENOUS | Status: DC

## 2015-10-16 MED ORDER — CALCIUM CARBONATE ANTACID 500 MG PO CHEW: 2.0000 | CHEWABLE_TABLET | Freq: Every day | ORAL | Status: DC | PRN

## 2015-10-16 MED ORDER — DIPHENHYDRAMINE HCL 25 MG PO CAPS: 25.0000 mg | ORAL_CAPSULE | Freq: Four times a day (QID) | ORAL | Status: DC | PRN

## 2015-10-16 MED ORDER — ONDANSETRON HCL 4 MG/2ML IJ SOLN
INTRAMUSCULAR | Status: AC
  Filled 2015-10-16: qty 2

## 2015-10-16 MED ORDER — MEPERIDINE HCL 25 MG/ML IJ SOLN
INTRAMUSCULAR | Status: DC | PRN
  Administered 2015-10-16 (×2): 12.5 mg via INTRAVENOUS

## 2015-10-16 MED ORDER — NALBUPHINE HCL 10 MG/ML IJ SOLN
5.0000 mg | INTRAMUSCULAR | Status: DC | PRN
  Administered 2015-10-16: 5 mg via SUBCUTANEOUS

## 2015-10-16 SURGICAL SUPPLY — 39 items
APL SKNCLS STERI-STRIP NONHPOA (GAUZE/BANDAGES/DRESSINGS) ×2
BENZOIN TINCTURE PRP APPL 2/3 (GAUZE/BANDAGES/DRESSINGS) ×4 IMPLANT
CLAMP CORD UMBIL (MISCELLANEOUS) IMPLANT
CLOSURE WOUND 1/2 X4 (GAUZE/BANDAGES/DRESSINGS) ×2
CLOTH BEACON ORANGE TIMEOUT ST (SAFETY) ×4 IMPLANT
CONTAINER PREFILL 10% NBF 15ML (MISCELLANEOUS) IMPLANT
DRAPE SHEET LG 3/4 BI-LAMINATE (DRAPES) IMPLANT
DRSG OPSITE POSTOP 4X10 (GAUZE/BANDAGES/DRESSINGS) ×4 IMPLANT
DURAPREP 26ML APPLICATOR (WOUND CARE) ×4 IMPLANT
ELECT REM PT RETURN 9FT ADLT (ELECTROSURGICAL) ×4
ELECTRODE REM PT RTRN 9FT ADLT (ELECTROSURGICAL) ×2 IMPLANT
EXTRACTOR VACUUM M CUP 4 TUBE (SUCTIONS) IMPLANT
EXTRACTOR VACUUM M CUP 4' TUBE (SUCTIONS)
GLOVE BIO SURGEON STRL SZ 6.5 (GLOVE) ×3 IMPLANT
GLOVE BIO SURGEONS STRL SZ 6.5 (GLOVE) ×1
GLOVE BIOGEL PI IND STRL 7.0 (GLOVE) ×2 IMPLANT
GLOVE BIOGEL PI INDICATOR 7.0 (GLOVE) ×2
GOWN STRL REUS W/TWL LRG LVL3 (GOWN DISPOSABLE) ×8 IMPLANT
KIT ABG SYR 3ML LUER SLIP (SYRINGE) IMPLANT
NDL HYPO 25X5/8 SAFETYGLIDE (NEEDLE) IMPLANT
NEEDLE HYPO 25X5/8 SAFETYGLIDE (NEEDLE) IMPLANT
NS IRRIG 1000ML POUR BTL (IV SOLUTION) ×4 IMPLANT
PACK C SECTION WH (CUSTOM PROCEDURE TRAY) ×4 IMPLANT
PAD OB MATERNITY 4.3X12.25 (PERSONAL CARE ITEMS) ×4 IMPLANT
PENCIL SMOKE EVAC W/HOLSTER (ELECTROSURGICAL) ×4 IMPLANT
RTRCTR C-SECT PINK 25CM LRG (MISCELLANEOUS) ×4 IMPLANT
STRIP CLOSURE SKIN 1/2X4 (GAUZE/BANDAGES/DRESSINGS) ×4 IMPLANT
SUT MNCRL 0 VIOLET CTX 36 (SUTURE) ×4 IMPLANT
SUT MONOCRYL 0 CTX 36 (SUTURE) ×4
SUT PLAIN 1 NONE 54 (SUTURE) IMPLANT
SUT PLAIN 2 0 XLH (SUTURE) ×6 IMPLANT
SUT VIC AB 0 CT1 27 (SUTURE) ×16
SUT VIC AB 0 CT1 27XBRD ANBCTR (SUTURE) ×4 IMPLANT
SUT VIC AB 2-0 CT1 27 (SUTURE) ×4
SUT VIC AB 2-0 CT1 TAPERPNT 27 (SUTURE) ×2 IMPLANT
SUT VIC AB 4-0 KS 27 (SUTURE) ×4 IMPLANT
SYR BULB IRRIGATION 50ML (SYRINGE) ×4 IMPLANT
TOWEL OR 17X24 6PK STRL BLUE (TOWEL DISPOSABLE) ×4 IMPLANT
TRAY FOLEY CATH SILVER 14FR (SET/KITS/TRAYS/PACK) IMPLANT

## 2015-10-16 NOTE — Addendum Note (Signed)
Addendum  created 10/16/15 1331 by Leilani AbleFranklin Rozell Theiler, MD   Modules edited: Anesthesia Blocks and Procedures, Clinical Notes   Clinical Notes:  File: [id number]

## 2015-10-16 NOTE — Op Note (Signed)
NAMMarland Kitchen:  Cheryl [name], Cheryl [name]                ACCOUNT NO.:  [id number]  MEDICAL RECORD NO.:  [id number]  LOCATION:  [facility]                          FACILITY:  [facility]  PHYSICIAN:  Sherron MondayJody Bovard, MD        DATE OF BIRTH:  Jan 14, 1986  DATE OF PROCEDURE:  10/16/2015 DATE OF DISCHARGE:                              OPERATIVE REPORT   PREOPERATIVE DIAGNOSES:  Intrauterine pregnancy at term, history of low- transverse cesarean section x2, undesired fertility.  POSTOPERATIVE DIAGNOSES:  Intrauterine pregnancy at term, history of low- transverse cesarean section x2, undesired fertility, delivered.  PROCEDURE:  Repeat low-transverse cesarean section, bilateral tubal ligation by bilateral salpingectomy.  SURGEON:  Sherron MondayJody Bovard, MD  ASSISTANT:  Georges Lynchracey Sumner, RNFA  FINDINGS:  Viable female infant at 7:43 a.m. with Apgars of 9 at 1 minute and 9 at 5 minutes, and weight pending at the time of dictation. Normal uterus, tubes, and ovaries are noted.  ANESTHESIA:  Spinal.  IV FLUIDS:  2500 mL.  URINE OUTPUT:  350 mL clear urine at the end of the procedure.  EBL:  Approximately 600 mL.  COMPLICATIONS:  None.  PATHOLOGY:  The placenta are to disposal and bilateral tubal segments to Pathology.  DESCRIPTION OF PROCEDURE:  After informed consent was reviewed with the patient including risks, benefits, and alternatives of surgical procedure, she was transported to the OR, placed on the table supine with a leftward tilt, prepped and draped in a normal sterile fashion. After spinal anesthesia had been induced and found to be adequate. Foley catheter was sterilely placed.  A Pfannenstiel skin incision was then made at the level of her previous incision and carried through the underlying layer of fascia sharply.  The fascia was incised in the midline.  Midline incision was extended laterally with Mayo scissors. The superior aspect of the fascial incision was grasped with Kocher clamps and remaining of the rectus  muscles were dissected off both bluntly and sharply.  The midline was identified with pickups and the peritoneum was entered sharply.  The peritoneal incision was extended with good visualization of the bladder.  The Alexis skin retractor was placed carefully making sure that no bowel was entrapped.  The uterus was incised in a transverse fashion and the infant was delivered from the vertex presentation with the aid of a vacuum.  The placenta was expressed from the uterus, had given to cord blood collection and then along to the placental study.  The uterine incision was closed with 2 layers of 0 Monocryl, the first of which was running locked and the second was an imbricating layer, this was noted to be hemostatic.  The tubes were then identified and isolated using a [name].  They were doubly ligated with plain gut suture, noted to be hemostatic bilaterally.  The uterine incision was made.  A hemostatic Bovie on small area of bleeding that had been noted.  The Alexis skin retractor was placed, removed. The peritoneum was reapproximated with 2-0 Vicryl.  The subfascial planes were inspected and found to be hemostatic as were the rectus muscles.  The fascia was closed from either angle with 0 Vicryl overlapping in the midline.  The  subcuticular adipose layer was made hemostatic with Bovie cautery and the dead space was created with plain gut.  The skin was undermined as the incision initially had indented. The skin was reapproximated using 4-0 Vicryl on a Keith needle.  Benzoin and Steri-Strips were applied.  Sponge, lap, and needle count was correct x2 per the operating staff.  The patient tolerated the procedure well and was taken in stable condition to the PACU.     Sherron Monday, MD     JB/MEDQ  D:  10/16/2015  T:  10/16/2015  Job:  [id number]

## 2015-10-16 NOTE — Lactation Note (Signed)
This note was copied from the chart of Girl Eileen StanfordSarah Furrow. Lactation Consultation Note  Patient Name: Girl Eileen StanfordSarah Marbach EXBMW'UToday's Date: 10/16/2015 Reason for consult: Initial assessment Baby at 11 hr of life and mom stated that bf is not going well. She reports that she only bf her children 1-2 days until "I am over it". She reports blistered nipples, but refused latch help. She is already offering bottles and pacifiers. Offered DEBP and she declined. Encouraged mom to [facility] or RN help while she was in patient. Left lactation handouts. She is aware of OP services and support group.   Maternal Data Has patient been taught Hand Expression?: Yes Does the patient have breastfeeding experience prior to this delivery?: Yes  Feeding    LATCH Score/Interventions                      Lactation Tools Discussed/Used     Consult Status Consult Status: Complete    Rulon Eisenmengerlizabeth E Equan Cogbill 10/16/2015, 7:32 PM

## 2015-10-16 NOTE — Interval H&P Note (Signed)
History and Physical Interval Note:  10/16/2015 7:07 AM  [name]  has presented today for surgery, with the diagnosis of Repeat C/Section, Sterilization  The various methods of treatment have been discussed with the patient and family. After consideration of risks, benefits and other options for treatment, the patient has consented to  Procedure(s) with comments: CESAREAN SECTION WITH BILATERAL TUBAL LIGATION (Bilateral) - MD requests RNFA as a surgical intervention .  The patient's history has been reviewed, patient examined, no change in status, stable for surgery.  I have reviewed the patient's chart and labs.  Questions were answered to the patient's satisfaction.     [name], Jery Hollern

## 2015-10-16 NOTE — Anesthesia Postprocedure Evaluation (Signed)
Anesthesia Post Note  Patient: Cheryl [name]  Procedure(s) Performed: Procedure(s) (LRB): CESAREAN SECTION (N/A) BILATERAL TUBAL LIGATION (Bilateral)  Patient location during evaluation: PACU Anesthesia Type: Spinal Level of consciousness: awake Pain management: pain level controlled Vital Signs Assessment: post-procedure vital signs reviewed and stable Respiratory status: spontaneous breathing Cardiovascular status: stable Postop Assessment: no headache, no backache, spinal receding, patient able to bend at knees and no signs of nausea or vomiting Anesthetic complications: no    Last Vitals:  Filed Vitals:   10/16/15 0930 10/16/15 0956  BP: 103/66 100/58  Pulse: 75 86  Temp:    Resp: 17 17    Last Pain: There were no vitals filed for this visit.               Lyra Alaimo JR,JOHN Susann GivensFRANKLIN

## 2015-10-16 NOTE — Brief Op Note (Signed)
10/16/2015  8:44 AM  PATIENT:  Cheryl [name]  30 y.o. female  PRE-OPERATIVE DIAGNOSIS:  Repeat C/Section, Sterilization  POST-OPERATIVE DIAGNOSIS:  Repeat C/Section, Sterilization  PROCEDURE:  Procedure(s): CESAREAN SECTION (N/A) BILATERAL TUBAL LIGATION (Bilateral Salpingectomy)  SURGEON:  Surgeon(s) and Role:    * Sherian ReinJody [name], MD - Primary  ASSISTANTS: Karmen StabsSumner, Tracie RNFA   FINDINGS: viable female infant at 7:43am, apgars 9/9, weight P; nl uterus, tubes and ovaries  ANESTHESIA:   spinal  EBL:  Total I/O In: 2500 [I.V.:2500] Out: 950 [Urine:350; Blood:600]  BLOOD ADMINISTERED:none  DRAINS: Urinary Catheter (Foley)   LOCAL MEDICATIONS USED:  NONE  SPECIMEN:  Source of Specimen:  Placenta and B tubes  DISPOSITION OF SPECIMEN: disposal and  PATHOLOGY  COUNTS:  YES  TOURNIQUET:  * No tourniquets in log *  DICTATION: .Other Dictation: Dictation Number [id number]  PLAN OF CARE: Admit to inpatient   PATIENT DISPOSITION:  PACU - hemodynamically stable.   Delay start of Pharmacological VTE agent (>24hrs) due to surgical blood loss or risk of bleeding: not applicable

## 2015-10-16 NOTE — Anesthesia Procedure Notes (Addendum)
Spinal Patient location during procedure: OR Start time: 10/16/2015 7:18 AM End time: 10/16/2015 7:21 AM Staffing Anesthesiologist: Leilani AbleHATCHETT, Mahin Guardia Performed by: anesthesiologist  Preanesthetic Checklist Completed: patient identified, surgical consent, pre-op evaluation, timeout performed, IV checked, risks and benefits discussed and monitors and equipment checked Spinal Block Patient position: sitting Prep: site prepped and draped and DuraPrep Patient monitoring: heart rate, cardiac monitor, continuous pulse ox and blood pressure Approach: midline Location: L3-4 Injection technique: single-shot Needle Needle type: Pencan  Needle gauge: 24 G Needle length: 9 cm Needle insertion depth: 5 cm Assessment Sensory level: T4

## 2015-10-16 NOTE — Anesthesia Postprocedure Evaluation (Signed)
Anesthesia Post Note  Patient: Cheryl [name]  Procedure(s) Performed: Procedure(s) (LRB): CESAREAN SECTION (N/A) BILATERAL TUBAL LIGATION (Bilateral)  Patient location during evaluation: Mother Baby Anesthesia Type: Spinal Level of consciousness: awake and alert and oriented Pain management: satisfactory to patient Vital Signs Assessment: post-procedure vital signs reviewed and stable Respiratory status: respiratory function stable and spontaneous breathing Cardiovascular status: blood pressure returned to baseline Postop Assessment: no headache, no backache, spinal receding, patient able to bend at knees and adequate PO intake Anesthetic complications: no    Last Vitals:  Filed Vitals:   10/16/15 1745 10/16/15 2145  BP: 100/49 92/69  Pulse: 69 64  Temp: 36.7 C 36.7 C  Resp: 18 16    Last Pain:  Filed Vitals:   10/16/15 2148  PainSc: 6                  Aleeya Veitch

## 2015-10-16 NOTE — Addendum Note (Signed)
Addendum  created 10/16/15 2247 by Graciela HusbandsWynn O Jessup Ogas, CRNA   Modules edited: Notes Section   Notes Section:  File: [id number]

## 2015-10-16 NOTE — Anesthesia Preprocedure Evaluation (Signed)
Anesthesia Evaluation  Patient identified by MRN, date of birth, ID band Patient awake    Reviewed: Allergy & Precautions, NPO status , Patient's Chart, lab work & pertinent test results  Airway Mallampati: II  TM Distance: >3 FB Neck ROM: Full    Dental no notable dental hx.    Pulmonary former smoker,    Pulmonary exam normal breath sounds clear to auscultation       Cardiovascular negative cardio ROS Normal cardiovascular exam Rhythm:Regular Rate:Normal     Neuro/Psych negative neurological ROS  negative psych ROS   GI/Hepatic negative GI ROS, Neg liver ROS,   Endo/Other  negative endocrine ROS  Renal/GU negative Renal ROS  negative genitourinary   Musculoskeletal negative musculoskeletal ROS (+)   Abdominal   Peds negative pediatric ROS (+)  Hematology negative hematology ROS (+)   Anesthesia Other Findings   Reproductive/Obstetrics (+) Pregnancy                             Anesthesia Physical Anesthesia Plan  ASA: II  Anesthesia Plan: Spinal   Post-op Pain Management:    Induction:   Airway Management Planned: Natural Airway  Additional Equipment:   Intra-op Plan:   Post-operative Plan:   Informed Consent: I have reviewed the patients History and Physical, chart, labs and discussed the procedure including the risks, benefits and alternatives for the proposed anesthesia with the patient or authorized representative who has indicated his/her understanding and acceptance.   Dental advisory given  Plan Discussed with: CRNA  Anesthesia Plan Comments:         Anesthesia Quick Evaluation  

## 2015-10-16 NOTE — Transfer of Care (Signed)
Immediate Anesthesia Transfer of Care Note  Patient: Cheryl [name]  Procedure(s) Performed: Procedure(s): CESAREAN SECTION (N/A) BILATERAL TUBAL LIGATION (Bilateral)  Patient Location: PACU  Anesthesia Type:Spinal  Level of Consciousness: awake, alert , oriented and patient cooperative  Airway & Oxygen Therapy: Patient Spontanous Breathing  Post-op Assessment: Report given to RN and Post -op Vital signs reviewed and stable  Post vital signs: Reviewed and stable  Last Vitals:  Filed Vitals:   10/16/15 0626  BP: 110/70  Pulse: 88  Resp: 20    Complications: No apparent anesthesia complications

## 2015-10-17 ENCOUNTER — Encounter (HOSPITAL_COMMUNITY): Payer: Self-pay | Admitting: Obstetrics and Gynecology

## 2015-10-17 LAB — CBC
HEMATOCRIT: 29.2 % — AB (ref 36.0–46.0)
HEMOGLOBIN: 9.1 g/dL — AB (ref 12.0–15.0)
MCH: 25.3 pg — ABNORMAL LOW (ref 26.0–34.0)
MCHC: 31.2 g/dL (ref 30.0–36.0)
MCV: 81.1 fL (ref 78.0–100.0)
Platelets: 219 10*3/uL (ref 150–400)
RBC: 3.6 MIL/uL — AB (ref 3.87–5.11)
RDW: 14.5 % (ref 11.5–15.5)
WBC: 10.7 10*3/uL — AB (ref 4.0–10.5)

## 2015-10-17 LAB — BIRTH TISSUE RECOVERY COLLECTION (PLACENTA DONATION)

## 2015-10-17 NOTE — Progress Notes (Signed)
Subjective: Postpartum Day 1: Cesarean Delivery Patient reports incisional pain and tolerating PO.    Objective: Vital signs in last 24 hours: Temp:  [97.2 F (36.2 C)-98.5 F (36.9 C)] 97.4 F (36.3 C) (01/13 0553) Pulse Rate:  [53-86] 53 (01/13 0553) Resp:  [14-19] 16 (01/13 0553) BP: (92-110)/(45-91) 92/45 mmHg (01/13 0553) SpO2:  [93 %-100 %] 93 % (01/12 2145)  Physical Exam:  General: alert and no distress Lochia: appropriate Uterine Fundus: firm Incision: healing well DVT Evaluation: No evidence of DVT seen on physical exam.   Recent Labs  10/14/15 1200 10/17/15 0513  HGB 12.0 9.1*  HCT 37.3 29.2*    Assessment/Plan: Status post Cesarean section. Doing well postoperatively.  Continue current care.  [name], Cheryl Surber 10/17/2015, 7:36 AM

## 2015-10-18 NOTE — Progress Notes (Signed)
Patient ID: Cheryl [name], female   DOB: 05/06/1986, 30 y.o.   MRN: [id number] Pt sleeping soundly but easily arousable. Denies any pain, fever, chills or lightheadedness. Tol po and voiding well. No complaints VSS ABD- dressing c/d/i EXT - no Homans  A/P: POD#2 s/p r c/s, recovering well        Routine pp/post op care        Discharge to home tomorrow

## 2015-10-18 NOTE — Progress Notes (Addendum)
Patient said her breasts had been getting full, so she handpumped 30 ml of breastmilk.  Baby had been bottle feeding with similac 19.  Reminded mom that pumping to relieve pressure will increase milk supply. Mom said she intends to formula feed baby mostly, and understood ramifications of pumping.  Encouraged to feed baby breastmilk first before formula (which she did). Let her know options for dealing with any discomfort, including bra, coldpacks, motrin
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
## 2015-10-19 MED ORDER — IBUPROFEN 600 MG PO TABS: 600.0000 mg | ORAL_TABLET | Freq: Four times a day (QID) | ORAL | Status: DC | PRN

## 2015-10-19 MED ORDER — OXYCODONE-ACETAMINOPHEN 5-325 MG PO TABS: 1.0000 | ORAL_TABLET | ORAL | Status: DC | PRN

## 2015-10-19 NOTE — Discharge Summary (Signed)
OB Discharge Summary     Patient Name: Cheryl [name] DOB: 06-17-1986 MRN: [id number]  Date of admission: 10/16/2015 Delivering MD: Sherian Rein   Date of discharge: 10/19/2015  Admitting diagnosis: CTX Repeat CSection, Sterilization Intrauterine pregnancy: [redacted]w[redacted]d     Secondary diagnosis:  Active Problems:   S/P repeat low transverse C-section   S/P cesarean section  Additional problems: none     Discharge diagnosis: Term Pregnancy Delivered                                                                                                Post partum procedures:none  Augmentation: n/a  Complications: None  Hospital course:  Sceduled C/S and btl  30 y.o. yo G3P3003 at [redacted]w[redacted]d was admitted to the hospital 10/16/2015 for scheduled cesarean section with the following indication:Elective Repeat.  Membrane Rupture Time/Date: 7:41 AM ,10/16/2015   Patient delivered a Viable infant.10/16/2015  Details of operation can be found in separate operative note.  Pateint had an uncomplicated postpartum course.  She is ambulating, tolerating a regular diet, passing flatus, and urinating well. Patient is discharged home in stable condition on  10/19/2015          Physical exam  Filed Vitals:   10/17/15 1838 10/18/15 0630 10/18/15 1747 10/19/15 0535  BP: 101/67 90/58 109/64 92/61  Pulse: 79 60 62 60  Temp: 97.8 F (36.6 C) 97.6 F (36.4 C) 96.3 F (35.7 C) 97.6 F (36.4 C)  TempSrc: Oral  Axillary Oral  Resp: 18 16 18 18   SpO2:       General: cooperative and no distress Lochia: appropriate Uterine Fundus: firm Incision: Healing well with no significant drainage, Dressing is clean, dry, and intact DVT Evaluation: No evidence of DVT seen on physical exam. No significant calf/ankle edema. Labs: Lab Results  Component Value Date   WBC 10.7* 10/17/2015   HGB 9.1* 10/17/2015   HCT 29.2* 10/17/2015   MCV 81.1 10/17/2015   PLT 219 10/17/2015   CMP Latest Ref Rng 09/18/2013   Glucose 70 - 99 mg/dL 81  BUN 6 - 23 mg/dL 7  Creatinine 4.09 - 8.11 mg/dL 9.14  Sodium 782 - 956 mEq/L 136  Potassium 3.5 - 5.1 mEq/L 3.7  Chloride 96 - 112 mEq/L 101  CO2 19 - 32 mEq/L 26  Calcium 8.4 - 10.5 mg/dL 9.3  Total Protein 6.0 - 8.3 g/dL -  Total Bilirubin 0.3 - 1.2 mg/dL -  Alkaline Phos 39 - 213 U/L -  AST 0 - 37 U/L -  ALT 0 - 35 U/L -    Discharge instruction: per After Visit Summary and "Baby and Me Booklet".  After visit meds:    Medication List    TAKE these medications        calcium carbonate 500 MG chewable tablet  Commonly known as:  TUMS - dosed in mg elemental calcium  Chew 2 tablets by mouth daily as needed for indigestion or heartburn.     ibuprofen 600 MG tablet  Commonly known as:  ADVIL,MOTRIN  Take 1 tablet (600 mg total) by  mouth every 6 (six) hours as needed for mild pain.     oxyCODONE-acetaminophen 5-325 MG tablet  Commonly known as:  PERCOCET/ROXICET  Take 1 tablet by mouth every 4 (four) hours as needed for severe pain.     prenatal multivitamin Tabs tablet  Take 1 tablet by mouth daily at 12 noon.        Diet: routine diet  Activity: Advance as tolerated. Pelvic rest for 6 weeks.   Outpatient follow up:2 weeks and 6weeks Follow up Appt:No future appointments. Follow up Visit:No Follow-up on file.  Postpartum contraception: Tubal Ligation  Newborn Data: Live born female  Birth Weight: 7 lb 11.6 oz (3505 g) APGAR: 9, 9  Baby Feeding: Breast Disposition:home with mother   10/19/2015 Edwinna Areolaecilia Worema Banga, DO

## 2015-10-19 NOTE — Discharge Instructions (Signed)
Nothing in vagina for 6 weeks.  No sex, tampons, and douching.  Other instructions as in [facility] Discharge Booklet. °

## 2015-10-19 NOTE — Progress Notes (Signed)
Subjective: Postpartum Day 3: Cesarean Delivery Patient reports tolerating PO, + flatus and no problems voiding.    Objective: Vital signs in last 24 hours: Temp:  [96.3 F (35.7 C)-97.6 F (36.4 C)] 97.6 F (36.4 C) (01/15 0535) Pulse Rate:  [60-62] 60 (01/15 0535) Resp:  [18] 18 (01/15 0535) BP: (92-109)/(61-64) 92/61 mmHg (01/15 0535)  Physical Exam:  General: cooperative, fatigued, no distress and bonding with baby Lochia: appropriate Uterine Fundus: firm Incision: healing well, no significant drainage DVT Evaluation: No evidence of DVT seen on physical exam. No significant calf/ankle edema.   Recent Labs  10/17/15 0513  HGB 9.1*  HCT 29.2*    Assessment/Plan: Status post Cesarean section. Doing well postoperatively.  Discharge home with standard precautions and return to clinic in 2-6 weeks.  Fermin Yan Worema Kahla Risdon 10/19/2015, 10:07 AM

## 2016-04-26 ENCOUNTER — Encounter (HOSPITAL_COMMUNITY): Payer: Self-pay | Admitting: Emergency Medicine

## 2016-04-26 ENCOUNTER — Emergency Department (HOSPITAL_COMMUNITY): Payer: Medicaid Other

## 2016-04-26 ENCOUNTER — Emergency Department (HOSPITAL_COMMUNITY)
Admission: EM | Admit: 2016-04-26 | Discharge: 2016-04-26 | Disposition: A | Discharge status: Home / Self Care | Payer: Medicaid Other | Attending: Emergency Medicine | Admitting: Emergency Medicine

## 2016-04-26 DIAGNOSIS — Y939 Activity, unspecified: Secondary | ICD-10-CM | POA: Insufficient documentation

## 2016-04-26 DIAGNOSIS — F329 Major depressive disorder, single episode, unspecified: Secondary | ICD-10-CM | POA: Diagnosis not present

## 2016-04-26 DIAGNOSIS — Y999 Unspecified external cause status: Secondary | ICD-10-CM | POA: Diagnosis not present

## 2016-04-26 DIAGNOSIS — S0990XA Unspecified injury of head, initial encounter: Secondary | ICD-10-CM | POA: Diagnosis not present

## 2016-04-26 DIAGNOSIS — M1990 Unspecified osteoarthritis, unspecified site: Secondary | ICD-10-CM | POA: Insufficient documentation

## 2016-04-26 DIAGNOSIS — R110 Nausea: Secondary | ICD-10-CM | POA: Insufficient documentation

## 2016-04-26 DIAGNOSIS — Y929 Unspecified place or not applicable: Secondary | ICD-10-CM | POA: Diagnosis not present

## 2016-04-26 DIAGNOSIS — W501XXA Accidental kick by another person, initial encounter: Secondary | ICD-10-CM | POA: Diagnosis not present

## 2016-04-26 DIAGNOSIS — J45909 Unspecified asthma, uncomplicated: Secondary | ICD-10-CM | POA: Diagnosis not present

## 2016-04-26 DIAGNOSIS — Z87891 Personal history of nicotine dependence: Secondary | ICD-10-CM | POA: Insufficient documentation

## 2016-04-26 MED ORDER — ONDANSETRON 4 MG PO TBDP
4.0000 mg | ORAL_TABLET | Freq: Once | ORAL | Status: AC
  Administered 2016-04-26: 4 mg via ORAL
  Filled 2016-04-26: qty 1

## 2016-04-26 MED ORDER — ONDANSETRON 4 MG PO TBDP: 4.0000 mg | ORAL_TABLET | Freq: Three times a day (TID) | ORAL | 0 refills | Status: DC | PRN

## 2016-04-26 MED ORDER — IBUPROFEN 600 MG PO TABS: 600.0000 mg | ORAL_TABLET | Freq: Four times a day (QID) | ORAL | 1 refills | Status: DC | PRN

## 2016-04-26 MED ORDER — IBUPROFEN 600 MG PO TABS: 600.0000 mg | ORAL_TABLET | Freq: Four times a day (QID) | ORAL | 0 refills | Status: DC | PRN
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
## 2016-04-26 NOTE — Discharge Instructions (Signed)
Your CT Scan is normal.  °
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
## 2016-10-29 ENCOUNTER — Emergency Department (HOSPITAL_COMMUNITY)
Admission: EM | Admit: 2016-10-29 | Discharge: 2016-10-29 | Disposition: A | Discharge status: Home / Self Care | Payer: Medicaid Other | Attending: Emergency Medicine | Admitting: Emergency Medicine

## 2016-10-29 ENCOUNTER — Encounter (HOSPITAL_COMMUNITY): Payer: Self-pay

## 2016-10-29 DIAGNOSIS — J45909 Unspecified asthma, uncomplicated: Secondary | ICD-10-CM | POA: Insufficient documentation

## 2016-10-29 DIAGNOSIS — N10 Acute tubulo-interstitial nephritis: Secondary | ICD-10-CM | POA: Diagnosis not present

## 2016-10-29 DIAGNOSIS — Z87891 Personal history of nicotine dependence: Secondary | ICD-10-CM | POA: Diagnosis not present

## 2016-10-29 DIAGNOSIS — R509 Fever, unspecified: Secondary | ICD-10-CM | POA: Diagnosis present

## 2016-10-29 LAB — URINALYSIS, ROUTINE W REFLEX MICROSCOPIC
BILIRUBIN URINE: NEGATIVE
Glucose, UA: NEGATIVE mg/dL
KETONES UR: 80 mg/dL — AB
Nitrite: POSITIVE — AB
PH: 6 (ref 5.0–8.0)
Protein, ur: 30 mg/dL — AB
SPECIFIC GRAVITY, URINE: 1.02 (ref 1.005–1.030)
SQUAMOUS EPITHELIAL / LPF: NONE SEEN

## 2016-10-29 MED ORDER — CIPROFLOXACIN HCL 500 MG PO TABS: 500.0000 mg | ORAL_TABLET | Freq: Two times a day (BID) | ORAL | 0 refills | Status: DC

## 2016-10-29 NOTE — Discharge Instructions (Signed)
Take the prescribed medication as directed.  May continue taking AZO for symptom relief. Follow-up with your primary care doctor if not improving in the next 48-72 hours. Return to the ED for new concerns.

## 2016-10-29 NOTE — ED Triage Notes (Signed)
Pt with flank pain, hematuria, fever and nausea.  Burns with urination.  Started yesterday

## 2016-10-29 NOTE — ED Provider Notes (Signed)
[facility]-EMER[facility]Y DEPT Provider Note   CSN: [id number] Arrival date & time: 10/29/16  1803     History   Chief Complaint Chief Complaint  Patient presents with  . Hematuria  . Fever    HPI Cheryl [name] is a 31 y.o. female.  The history is provided by the patient and medical records.  Hematuria   Fever     31 year old female with history of arthritis, bipolar disorder, depression, GERD, PTSD, presenting to the ED for bilateral flank pain, dysuria, and urinary frequency, onset yesterday.  Reports fever this morning, none currently.  She reports nausea but denies vomiting or diarrhea.  Has been able to eat and drink normally.  Hx of UTI in the past with similar symptoms.  No hx of kidney stones.  Hx of tubal ligation.  Has been taking AZO at home with some relief.  Past Medical History:  Diagnosis Date  . Abnormal Pap smear   . Arthritis    bilateral jaws  . Asthma    as a child  . Bipolar 1 disorder (HCC)   . Depression   . GERD (gastroesophageal reflux disease)    with pregnancy  . Headache   . HSV infection   . PTSD (post-traumatic stress disorder)     Patient Active Problem List   Diagnosis Date Noted  . S/P cesarean section 10/16/2015  . S/P repeat low transverse C-section 04/14/2014    Past Surgical History:  Procedure Laterality Date  . CESAREAN SECTION  04/30/2012   Procedure: CESAREAN SECTION;  Surgeon: Antionette Char, MD;  Location: [facility];  Service: Gynecology;  Laterality: N/A;  Primary cesarean section of baby girl at 2113   APGAR  9/9  . CESAREAN SECTION N/A 04/14/2014   Procedure: CESAREAN SECTION;  Surgeon: Oliver Pila, MD;  Location: [facility];  Service: Obstetrics;  Laterality: N/A;  . CESAREAN SECTION N/A 10/16/2015   Procedure: CESAREAN SECTION;  Surgeon: Sherian Rein, MD;  Location: [facility];  Service: Obstetrics;  Laterality: N/A;  . COLPOSCOPY W/ BIOPSY / CURETTAGE    . TUBAL LIGATION Bilateral 10/16/2015   Procedure: BILATERAL  TUBAL LIGATION;  Surgeon: Sherian Rein, MD;  Location: [facility];  Service: Obstetrics;  Laterality: Bilateral;  . WISDOM TOOTH EXTRACTION      OB History    Gravida Para Term Preterm AB Living   3 3 3     3    SAB TAB Ectopic Multiple Live Births         0 3       Home Medications    Prior to Admission medications   Medication Sig Start Date End Date Taking? Authorizing Provider  calcium carbonate (TUMS - DOSED IN MG ELEMENTAL CALCIUM) 500 MG chewable tablet Chew 2 tablets by mouth daily as needed for indigestion or heartburn.    Historical Provider, MD  ibuprofen (ADVIL,MOTRIN) 600 MG tablet Take 1 tablet (600 mg total) by mouth every 6 (six) hours as needed. 04/26/16   Earley Favor, NP  ibuprofen (ADVIL,MOTRIN) 600 MG tablet Take 1 tablet (600 mg total) by mouth every 6 (six) hours as needed for mild pain. 04/26/16   Earley Favor, NP  ondansetron (ZOFRAN ODT) 4 MG disintegrating tablet Take 1 tablet (4 mg total) by mouth every 8 (eight) hours as needed for nausea or vomiting. 04/26/16   Earley Favor, NP  oxyCODONE-acetaminophen (PERCOCET/ROXICET) 5-325 MG tablet Take 1 tablet by mouth every 4 (four) hours as needed for severe pain. 10/19/15  Cecilia Worema Banga, DO  Prenatal Vit-Fe Fumarate-FA (PRENATAL MULTIVITAMIN) TABS tablet Take 1 tablet by mouth daily at 12 noon.    Historical Provider, MD    Family History Family History  Problem Relation Age of Onset  . Other Neg Hx     Social History Social History  Substance Use Topics  . Smoking status: Former Smoker    Packs/day: 1.00    Years: 3.00    Types: Cigarettes    Quit date: 10/30/2010  . Smokeless tobacco: Never Used  . Alcohol use Yes     Comment: none since pregnancy     Allergies   Patient has no known allergies.   Review of Systems Review of Systems  Constitutional: Positive for fever.  Genitourinary: Positive for dysuria, frequency and hematuria.  All other systems reviewed and are  negative.    Physical Exam Updated Vital Signs BP 116/70 (BP Location: Left Arm)   Pulse 82   Temp 98.3 F (36.8 C) (Oral)   Resp 18   LMP 10/11/2016   SpO2 99%   Physical Exam  Constitutional: She is oriented to person, place, and time. She appears well-developed and well-nourished.  Non-toxic, eating snacks and drinking sprite in room  HENT:  Head: Normocephalic and atraumatic.  Mouth/Throat: Oropharynx is clear and moist.  Eyes: Conjunctivae and EOM are normal. Pupils are equal, round, and reactive to light.  Neck: Normal range of motion.  Cardiovascular: Normal rate, regular rhythm and normal heart sounds.   Pulmonary/Chest: Effort normal and breath sounds normal. No respiratory distress. She has no wheezes. She has no rhonchi.  Abdominal: Soft. Bowel sounds are normal. There is no tenderness. There is CVA tenderness (mild, bilateral). There is no rebound.  Musculoskeletal: Normal range of motion.  Neurological: She is alert and oriented to person, place, and time.  Skin: Skin is warm and dry.  Psychiatric: She has a normal mood and affect.  Nursing note and vitals reviewed.    ED Treatments / Results  Labs (all labs ordered are listed, but only abnormal results are displayed) Labs Reviewed  URINALYSIS, ROUTINE W REFLEX MICROSCOPIC - Abnormal; Notable for the following:       Result Value   Color, Urine AMBER (*)    APPearance HAZY (*)    Hgb urine dipstick SMALL (*)    Ketones, ur 80 (*)    Protein, ur 30 (*)    Nitrite POSITIVE (*)    Leukocytes, UA SMALL (*)    Bacteria, UA MANY (*)    Non Squamous Epithelial 0-5 (*)    All other components within normal limits    EKG  EKG Interpretation None       Radiology No results found.  Procedures Procedures (including critical care time)  Medications Ordered in ED Medications - No data to display   Initial Impression / Assessment and Plan / ED Course  I have reviewed the triage vital signs and the  nursing notes.  Pertinent labs & imaging results that were available during my care of the patient were reviewed by me and considered in my medical decision making (see chart for details).  31 y.o. F here with flank pain and urinary symptoms, onset yesterday.  Reports fever earlier today, none currently.  She is afebrile, non-toxic.  NAD.  Eating and drinking in room without issue.  Mild CVA tenderness on exam bilaterally.  UA nitrite +, will send for culture.  Patient is s/p tubal ligation, denies possibility of pregnancy.  Will start on cipro for coverage of likely pyelonephritis, continue AZO for dysuria.  FU with PCP.  Discussed plan with patient, she acknowledged understanding and agreed with plan of care.  Return precautions given for new or worsening symptoms.  Final Clinical Impressions(s) / ED Diagnoses   Final diagnoses:  Acute pyelonephritis    New Prescriptions New Prescriptions   CIPROFLOXACIN (CIPRO) 500 MG TABLET    Take 1 tablet (500 mg total) by mouth every 12 (twelve) hours.     Garlon Hatchet, PA-C 10/29/16 2047    Lorre Nick, MD 11/02/16 [phone]

## 2016-11-01 LAB — URINE CULTURE

## 2016-11-02 ENCOUNTER — Telehealth: Payer: Self-pay | Admitting: Emergency Medicine

## 2016-11-02 NOTE — Telephone Encounter (Signed)
Post ED Visit - Positive Culture Follow-up  Culture report reviewed by antimicrobial stewardship pharmacist:  []  Enzo BiNathan Batchelder, Pharm.D. []  Celedonio MiyamotoJeremy Frens, Pharm.D., BCPS []  Garvin FilaMike Maccia, Pharm.D. []  Georgina PillionElizabeth Martin, Pharm.D., BCPS []  Old MiakkaMinh Pham, [address]Pharm.D., BCPS, AAHIVP []  Estella HuskMichelle Turner, Pharm.D., BCPS, AAHIVP []  Tennis Mustassie Stewart, Pharm.D. []  Sherle Poeob Vincent, [address]Ph[address]Carylon Perches. Maggie Shuda PharmD  Positive urine culture Treated with ciprofloxacin, organism sensitive to the same and no further patient follow-up is required at this time.  Berle MullMiller, Micco Bourbeau 11/02/2016, 1:27 PM

## 2017-10-13 ENCOUNTER — Emergency Department (HOSPITAL_COMMUNITY): Payer: Medicaid Other

## 2017-10-13 ENCOUNTER — Encounter (HOSPITAL_COMMUNITY): Payer: Self-pay

## 2017-10-13 DIAGNOSIS — Z87891 Personal history of nicotine dependence: Secondary | ICD-10-CM | POA: Insufficient documentation

## 2017-10-13 DIAGNOSIS — Z79899 Other long term (current) drug therapy: Secondary | ICD-10-CM | POA: Diagnosis not present

## 2017-10-13 DIAGNOSIS — J45909 Unspecified asthma, uncomplicated: Secondary | ICD-10-CM | POA: Diagnosis not present

## 2017-10-13 DIAGNOSIS — R05 Cough: Secondary | ICD-10-CM | POA: Insufficient documentation

## 2017-10-13 NOTE — ED Triage Notes (Signed)
Pt presents with 1 month h/o productive cough that is clear with small white objects that were noted a few days ago.  Pt also reports intermittent lower abdominal pain and noted small white strings in her stool today.  Pt reports her son has same white objects in his stool.

## 2017-10-14 ENCOUNTER — Emergency Department (HOSPITAL_COMMUNITY)
Admission: EM | Admit: 2017-10-14 | Discharge: 2017-10-14 | Disposition: A | Discharge status: Home / Self Care | Payer: Medicaid Other | Attending: Emergency Medicine | Admitting: Emergency Medicine

## 2017-10-14 DIAGNOSIS — R05 Cough: Secondary | ICD-10-CM

## 2017-10-14 DIAGNOSIS — R059 Cough, unspecified: Secondary | ICD-10-CM

## 2017-10-14 NOTE — ED Provider Notes (Signed)
[facility] EMERGENCY DEPARTMENT Provider Note   CSN: [id number] Arrival date & time: 10/13/17  2155     History   Chief Complaint No chief complaint on file.   HPI Cheryl [name] is a 32 y.o. female.  The history is provided by the patient.  Cough  This is a new problem. The current episode started more than 1 week ago. The problem occurs every few hours. The problem has been gradually worsening. The cough is non-productive. There has been no fever. Associated symptoms include shortness of breath. Pertinent negatives include no chest pain. She has tried nothing for the symptoms.  Patient presents with cough for the past month. It is mostly nonproductive, no hemoptysis reported No significant chest pain reported She reports she smokes a pack and half a day  Also reports she would like to have an exam to see if she has pinworms, she reports her son may also have it No abdominal pain is reported   Past Medical History:  Diagnosis Date  . Abnormal Pap smear   . Arthritis    bilateral jaws  . Asthma    as a child  . Bipolar 1 disorder (HCC)   . Depression   . GERD (gastroesophageal reflux disease)    with pregnancy  . Headache   . HSV infection   . PTSD (post-traumatic stress disorder)     Patient Active Problem List   Diagnosis Date Noted  . S/P cesarean section 10/16/2015  . S/P repeat low transverse C-section 04/14/2014    Past Surgical History:  Procedure Laterality Date  . CESAREAN SECTION  04/30/2012   Procedure: CESAREAN SECTION;  Surgeon: Antionette Char, MD;  Location: [facility];  Service: Gynecology;  Laterality: N/A;  Primary cesarean section of baby girl at 2113   APGAR  9/9  . CESAREAN SECTION N/A 04/14/2014   Procedure: CESAREAN SECTION;  Surgeon: Oliver Pila, MD;  Location: [facility];  Service: Obstetrics;  Laterality: N/A;  . CESAREAN SECTION N/A 10/16/2015   Procedure: CESAREAN SECTION;  Surgeon: Sherian Rein, MD;   Location: [facility];  Service: Obstetrics;  Laterality: N/A;  . COLPOSCOPY W/ BIOPSY / CURETTAGE    . TUBAL LIGATION Bilateral 10/16/2015   Procedure: BILATERAL TUBAL LIGATION;  Surgeon: Sherian Rein, MD;  Location: [facility];  Service: Obstetrics;  Laterality: Bilateral;  . WISDOM TOOTH EXTRACTION      OB History    Gravida Para Term Preterm AB Living   3 3 3     3    SAB TAB Ectopic Multiple Live Births         0 3       Home Medications    Prior to Admission medications   Medication Sig Start Date End Date Taking? Authorizing Provider  calcium carbonate (TUMS - DOSED IN MG ELEMENTAL CALCIUM) 500 MG chewable tablet Chew 2 tablets by mouth daily as needed for indigestion or heartburn.    [provider]  ciprofloxacin (CIPRO) 500 MG tablet Take 1 tablet (500 mg total) by mouth every 12 (twelve) hours. 10/29/16   Garlon Hatchet, PA-C  ibuprofen (ADVIL,MOTRIN) 600 MG tablet Take 1 tablet (600 mg total) by mouth every 6 (six) hours as needed. 04/26/16   Earley Favor, NP  ibuprofen (ADVIL,MOTRIN) 600 MG tablet Take 1 tablet (600 mg total) by mouth every 6 (six) hours as needed for mild pain. 04/26/16   Earley Favor, NP  ondansetron (ZOFRAN ODT) 4 MG disintegrating tablet Take  1 tablet (4 mg total) by mouth every 8 (eight) hours as needed for nausea or vomiting. 04/26/16   Earley Favor, NP  oxyCODONE-acetaminophen (PERCOCET/ROXICET) 5-325 MG tablet Take 1 tablet by mouth every 4 (four) hours as needed for severe pain. 10/19/15   Edwinna Areola, DO  Prenatal Vit-Fe Fumarate-FA (PRENATAL MULTIVITAMIN) TABS tablet Take 1 tablet by mouth daily at 12 noon.    [provider]    Family History Family History  Problem Relation Age of Onset  . Other Neg Hx     Social History Social History   Tobacco Use  . Smoking status: Former Smoker    Packs/day: 1.00    Years: 3.00    Pack years: 3.00    Types: Cigarettes    Last attempt to quit: 10/30/2010    Years since  quitting: 6.9  . Smokeless tobacco: Never Used  Substance Use Topics  . Alcohol use: Yes    Comment: none since pregnancy  . Drug use: No    Comment: Former     Allergies   Patient has no known allergies.   Review of Systems Review of Systems  Constitutional: Negative for fever.  Respiratory: Positive for cough and shortness of breath.   Cardiovascular: Negative for chest pain.  Gastrointestinal: Negative for abdominal pain and vomiting.  All other systems reviewed and are negative.    Physical Exam Updated Vital Signs BP 114/81 (BP Location: Right Arm)   Pulse 95   Temp 98.3 F (36.8 C) (Oral)   Resp 18   LMP 10/04/2017 (Approximate)   SpO2 96%   Physical Exam CONSTITUTIONAL: Well developed/well nourished HEAD: Normocephalic/atraumatic EYES: EOMI/PERRL ENMT: Mucous membranes moist NECK: supple no meningeal signs SPINE/BACK:entire spine nontender CV: S1/S2 noted, no murmurs/rubs/gallops noted LUNGS: Lungs are clear to auscultation bilaterally, no apparent distress ABDOMEN: soft, nontender, no rebound or guarding, bowel sounds noted throughout abdomen GU:no cva tenderness Rectal -  No evidence pinworms, no hemorrhoids, no blood noted, female nurse chaperone present for exam NEURO: Pt is awake/alert/appropriate, moves all extremitiesx4.  No facial droop.   EXTREMITIES: pulses normal/equal, full ROM SKIN: warm, color normal PSYCH: no abnormalities of mood noted, alert and oriented to situation   ED Treatments / Results  Labs (all labs ordered are listed, but only abnormal results are displayed) Labs Reviewed - No data to display  EKG  EKG Interpretation None       Radiology Dg Chest 2 View  Result Date: 10/13/2017 CLINICAL DATA:  One month of productive cough.  Smoker. EXAM: CHEST  2 VIEW COMPARISON:  None. FINDINGS: The heart size and mediastinal contours are within normal limits. Diffuse increase in interstitial prominence without alveolar  consolidation or pulmonary edema. No effusion or pneumothorax. The visualized skeletal structures are unremarkable. IMPRESSION: Bronchitic change of the lungs likely smoking related. Electronically Signed   By: Tollie Eth M.D.   On: 10/13/2017 23:04    Procedures Procedures (including critical care time)  Medications Ordered in ED Medications - No data to display   Initial Impression / Assessment and Plan / ED Course  I have reviewed the triage vital signs and the nursing notes.  Pertinent imaging results that were available during my care of the patient were reviewed by me and considered in my medical decision making (see chart for details).     5:17 AM X-ray negative, advised patient to quit smoking   Final Clinical Impressions(s) / ED Diagnoses   Final diagnoses:  Cough  ED Discharge Orders    None       Zadie RhineWickline, Rhodie Cienfuegos, MD 10/14/17 [phone]

## 2017-10-14 NOTE — ED Notes (Signed)
Pt departed in NAD, refused use of wheelchair.  

## 2018-02-28 ENCOUNTER — Emergency Department (HOSPITAL_COMMUNITY): Payer: Medicaid Other

## 2018-02-28 ENCOUNTER — Other Ambulatory Visit: Payer: Self-pay

## 2018-02-28 ENCOUNTER — Emergency Department (HOSPITAL_COMMUNITY)
Admission: EM | Admit: 2018-02-28 | Discharge: 2018-02-28 | Disposition: A | Discharge status: Home / Self Care | Payer: Medicaid Other | Attending: Emergency Medicine | Admitting: Emergency Medicine

## 2018-02-28 ENCOUNTER — Encounter (HOSPITAL_COMMUNITY): Payer: Self-pay | Admitting: *Deleted

## 2018-02-28 DIAGNOSIS — Y9389 Activity, other specified: Secondary | ICD-10-CM | POA: Insufficient documentation

## 2018-02-28 DIAGNOSIS — S0081XA Abrasion of other part of head, initial encounter: Secondary | ICD-10-CM | POA: Diagnosis not present

## 2018-02-28 DIAGNOSIS — Y999 Unspecified external cause status: Secondary | ICD-10-CM | POA: Insufficient documentation

## 2018-02-28 DIAGNOSIS — Y92410 Unspecified street and highway as the place of occurrence of the external cause: Secondary | ICD-10-CM | POA: Insufficient documentation

## 2018-02-28 DIAGNOSIS — F17200 Nicotine dependence, unspecified, uncomplicated: Secondary | ICD-10-CM | POA: Insufficient documentation

## 2018-02-28 DIAGNOSIS — Z79899 Other long term (current) drug therapy: Secondary | ICD-10-CM | POA: Insufficient documentation

## 2018-02-28 DIAGNOSIS — S40212A Abrasion of left shoulder, initial encounter: Secondary | ICD-10-CM | POA: Insufficient documentation

## 2018-02-28 DIAGNOSIS — F909 Attention-deficit hyperactivity disorder, unspecified type: Secondary | ICD-10-CM | POA: Insufficient documentation

## 2018-02-28 DIAGNOSIS — Z23 Encounter for immunization: Secondary | ICD-10-CM | POA: Insufficient documentation

## 2018-02-28 DIAGNOSIS — S4992XA Unspecified injury of left shoulder and upper arm, initial encounter: Secondary | ICD-10-CM | POA: Diagnosis present

## 2018-02-28 DIAGNOSIS — J45909 Unspecified asthma, uncomplicated: Secondary | ICD-10-CM | POA: Insufficient documentation

## 2018-02-28 HISTORY — DX: Attention-deficit hyperactivity disorder, unspecified type: F90.9

## 2018-02-28 HISTORY — DX: Schizophrenia, unspecified: F20.9

## 2018-02-28 MED ORDER — BACITRACIN ZINC 500 UNIT/GM EX OINT
TOPICAL_OINTMENT | Freq: Two times a day (BID) | CUTANEOUS | Status: DC
  Administered 2018-02-28: 1 via TOPICAL
  Filled 2018-02-28: qty 1.8

## 2018-02-28 MED ORDER — METHOCARBAMOL 750 MG PO TABS: 750.0000 mg | ORAL_TABLET | Freq: Three times a day (TID) | ORAL | 0 refills | Status: DC | PRN

## 2018-02-28 MED ORDER — CYCLOBENZAPRINE HCL 10 MG PO TABS
5.0000 mg | ORAL_TABLET | Freq: Once | ORAL | Status: AC
  Administered 2018-02-28: 5 mg via ORAL
  Filled 2018-02-28: qty 1

## 2018-02-28 MED ORDER — TETANUS-DIPHTH-ACELL PERTUSSIS 5-2.5-18.5 LF-MCG/0.5 IM SUSP
0.5000 mL | Freq: Once | INTRAMUSCULAR | Status: AC
Start: 2018-02-28 — End: 2018-02-28
  Administered 2018-02-28: 0.5 mL via INTRAMUSCULAR
  Filled 2018-02-28: qty 0.5
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
## 2018-02-28 NOTE — Discharge Instructions (Addendum)
Please take Ibuprofen (Advil, motrin) and Tylenol (acetaminophen) to relieve your pain.  You may take up to 600 MG (3 pills) of normal strength ibuprofen every 8 hours as needed.  In between doses of ibuprofen you make take tylenol, up to 1,000 mg (two extra strength pills).  Do not take more than 3,000 mg tylenol in a 24 hour period.  Please check all medication labels as many medications such as pain and cold medications may contain tylenol.  Do not drink alcohol while taking these medications.  Do not take other NSAID'S while taking ibuprofen (such as aleve or naproxen).  Please take ibuprofen with food to decrease stomach upset. ° °The best way to get rid of muscle pain is by taking NSAIDS, using heat, massage therapy, and gentle stretching/range of motion exercises. ° °Today you received medications that may make you sleepy or impair your ability to make decisions.  For the next 24 hours please do not drive, operate heavy machinery, care for a small child with out another adult present, or perform any activities that may cause harm to you or someone else if you were to fall asleep or be impaired.  ° °You are being prescribed a medication which may make you sleepy. Please follow up of listed precautions for at least 24 hours after taking one dose. ° °
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
## 2019-03-16 ENCOUNTER — Other Ambulatory Visit: Payer: Self-pay

## 2019-03-16 ENCOUNTER — Emergency Department (HOSPITAL_COMMUNITY)
Admission: EM | Admit: 2019-03-16 | Discharge: 2019-03-17 | Disposition: A | Discharge status: Home / Self Care | Payer: Medicaid Other | Attending: Emergency Medicine | Admitting: Emergency Medicine

## 2019-03-16 ENCOUNTER — Emergency Department (HOSPITAL_COMMUNITY): Payer: Medicaid Other

## 2019-03-16 ENCOUNTER — Encounter (HOSPITAL_COMMUNITY): Payer: Self-pay | Admitting: Emergency Medicine

## 2019-03-16 DIAGNOSIS — Z87891 Personal history of nicotine dependence: Secondary | ICD-10-CM | POA: Diagnosis not present

## 2019-03-16 DIAGNOSIS — R1031 Right lower quadrant pain: Secondary | ICD-10-CM | POA: Diagnosis present

## 2019-03-16 DIAGNOSIS — J45909 Unspecified asthma, uncomplicated: Secondary | ICD-10-CM | POA: Insufficient documentation

## 2019-03-16 LAB — URINALYSIS, ROUTINE W REFLEX MICROSCOPIC
Bilirubin Urine: NEGATIVE
Glucose, UA: NEGATIVE mg/dL
Hgb urine dipstick: NEGATIVE
Ketones, ur: NEGATIVE mg/dL
Leukocytes,Ua: NEGATIVE
Nitrite: NEGATIVE
Protein, ur: NEGATIVE mg/dL
Specific Gravity, Urine: 1.023 (ref 1.005–1.030)
pH: 5 (ref 5.0–8.0)

## 2019-03-16 LAB — COMPREHENSIVE METABOLIC PANEL
ALT: 13 U/L (ref 0–44)
AST: 13 U/L — ABNORMAL LOW (ref 15–41)
Albumin: 4.1 g/dL (ref 3.5–5.0)
Alkaline Phosphatase: 42 U/L (ref 38–126)
Anion gap: 7 (ref 5–15)
BUN: 9 mg/dL (ref 6–20)
CO2: 24 mmol/L (ref 22–32)
Calcium: 9.3 mg/dL (ref 8.9–10.3)
Chloride: 105 mmol/L (ref 98–111)
Creatinine, Ser: 0.55 mg/dL (ref 0.44–1.00)
GFR calc Af Amer: >60 mL/min (ref >60)
GFR calc non Af Amer: >60 mL/min (ref >60)
Glucose, Bld: 104 mg/dL — ABNORMAL HIGH (ref 70–99)
Potassium: 3.9 mmol/L (ref 3.5–5.1)
Sodium: 136 mmol/L (ref 135–145)
Total Bilirubin: 0.5 mg/dL (ref 0.3–1.2)
Total Protein: 7.1 g/dL (ref 6.5–8.1)

## 2019-03-16 LAB — CBC
HCT: 41.7 % (ref 36.0–46.0)
Hemoglobin: 14.1 g/dL (ref 12.0–15.0)
MCH: 29.4 pg (ref 26.0–34.0)
MCHC: 33.8 g/dL (ref 30.0–36.0)
MCV: 87.1 fL (ref 80.0–100.0)
Platelets: 231 10*3/uL (ref 150–400)
RBC: 4.79 MIL/uL (ref 3.87–5.11)
RDW: 12 % (ref 11.5–15.5)
WBC: 9.9 10*3/uL (ref 4.0–10.5)
nRBC: 0 % (ref 0.0–0.2)

## 2019-03-16 LAB — LIPASE, BLOOD: Lipase: 26 U/L (ref 11–51)

## 2019-03-16 LAB — I-STAT BETA HCG BLOOD, ED (MC, WL, AP ONLY): I-stat hCG, quantitative: <5 m[IU]/mL (ref <5)

## 2019-03-16 MED ORDER — IOHEXOL 300 MG/ML  SOLN
100.0000 mL | Freq: Once | INTRAMUSCULAR | Status: AC | PRN
  Administered 2019-03-16: 100 mL via INTRAVENOUS

## 2019-03-16 MED ORDER — SODIUM CHLORIDE 0.9% FLUSH: 3.0000 mL | Freq: Once | INTRAVENOUS | Status: DC

## 2019-03-16 NOTE — ED Notes (Signed)
Patient transported to CT scan . 

## 2019-03-16 NOTE — ED Provider Notes (Signed)
[facility] EMERGENCY DEPARTMENT Provider Note   CSN: [id number] Arrival date & time: 03/16/19  2105     History   Chief Complaint Chief Complaint  Patient presents with   Abdominal Pain    HPI Cheryl [name] is a 33 y.o. female.     Patient is a 33 year old female with past medical history of bipolar disorder, PTSD, asthma.  She presents today for evaluation of abdominal pain.  She has had a 2-day history of pain to her right lower quadrant.  This came on gradually and is worsening.  She denies any vomiting or diarrhea.  She denies any fevers or chills.  She denies any abnormal vaginal bleeding or discharge.  Her last period was 2 weeks ago and normal.  The history is provided by the patient.  Abdominal Pain Pain location:  RLQ Pain quality: gnawing   Pain radiates to:  Does not radiate Pain severity:  Moderate Timing:  Constant Progression:  Worsening Chronicity:  New Relieved by:  Nothing Worsened by:  Movement and palpation Ineffective treatments:  None tried Associated symptoms: nausea   Associated symptoms: no chills, no fever and no vomiting     Past Medical History:  Diagnosis Date   Abnormal Pap smear    ADHD    Arthritis    bilateral jaws   Asthma    as a child   Bipolar 1 disorder ([place])    Depression    GERD (gastroesophageal reflux disease)    with pregnancy   Headache    HSV infection    PTSD (post-traumatic stress disorder)    Schizophrenia ([place])     Patient Active Problem List   Diagnosis Date Noted   S/P cesarean section 10/16/2015   S/P repeat low transverse C-section 04/14/2014    Past Surgical History:  Procedure Laterality Date   CESAREAN SECTION  04/30/2012   Procedure: CESAREAN SECTION;  Surgeon: Lahoma Crocker, MD;  Location: [facility];  Service: Gynecology;  Laterality: N/A;  Primary cesarean section of baby girl at 2113   APGAR  9/9   CESAREAN SECTION N/A 04/14/2014   Procedure: CESAREAN  SECTION;  Surgeon: Logan Bores, MD;  Location: [facility];  Service: Obstetrics;  Laterality: N/A;   CESAREAN SECTION N/A 10/16/2015   Procedure: CESAREAN SECTION;  Surgeon: Janyth Contes, MD;  Location: [facility];  Service: Obstetrics;  Laterality: N/A;   COLPOSCOPY W/ BIOPSY / CURETTAGE     TUBAL LIGATION Bilateral 10/16/2015   Procedure: BILATERAL TUBAL LIGATION;  Surgeon: Janyth Contes, MD;  Location: [facility];  Service: Obstetrics;  Laterality: Bilateral;   WISDOM TOOTH EXTRACTION       OB History    Gravida  3   Para  3   Term  3   Preterm      AB      Living  3     SAB      TAB      Ectopic      Multiple  0   Live Births  3            Home Medications    Prior to Admission medications   Medication Sig Start Date End Date Taking? Authorizing Provider  calcium carbonate (TUMS - DOSED IN MG ELEMENTAL CALCIUM) 500 MG chewable tablet Chew 2 tablets by mouth daily as needed for indigestion or heartburn.    [provider]  ciprofloxacin (CIPRO) 500 MG tablet Take 1 tablet (500 mg total) by  mouth every 12 (twelve) hours. 10/29/16   Garlon HatchetSanders, Lisa M, PA-C  ibuprofen (ADVIL,MOTRIN) 600 MG tablet Take 1 tablet (600 mg total) by mouth every 6 (six) hours as needed. 04/26/16   Earley FavorSchulz, Gail, NP  ibuprofen (ADVIL,MOTRIN) 600 MG tablet Take 1 tablet (600 mg total) by mouth every 6 (six) hours as needed for mild pain. 04/26/16   Earley FavorSchulz, Gail, NP  methocarbamol (ROBAXIN) 750 MG tablet Take 1-2 tablets (750-1,500 mg total) by mouth 3 (three) times daily as needed for muscle spasms. 02/28/18   Cristina GongHammond, Elizabeth W, PA-C  ondansetron (ZOFRAN ODT) 4 MG disintegrating tablet Take 1 tablet (4 mg total) by mouth every 8 (eight) hours as needed for nausea or vomiting. 04/26/16   Earley FavorSchulz, Gail, NP  oxyCODONE-acetaminophen (PERCOCET/ROXICET) 5-325 MG tablet Take 1 tablet by mouth every 4 (four) hours as needed for severe pain. 10/19/15   Edwinna AreolaBanga, Cecilia Worema, DO    Prenatal Vit-Fe Fumarate-FA (PRENATAL MULTIVITAMIN) TABS tablet Take 1 tablet by mouth daily at 12 noon.    [provider]    Family History Family History  Problem Relation Age of Onset   Other Neg Hx     Social History Social History   Tobacco Use   Smoking status: Former Smoker    Packs/day: 1.00    Years: 3.00    Pack years: 3.00    Types: Cigarettes    Quit date: 10/30/2010    Years since quitting: 8.3   Smokeless tobacco: Never Used  Substance Use Topics   Alcohol use: Yes    Comment: Socially    Drug use: Not Currently    Comment: Rx for Suboxone     Allergies   Patient has no known allergies.   Review of Systems Review of Systems  Constitutional: Negative for chills and fever.  Gastrointestinal: Positive for abdominal pain and nausea. Negative for vomiting.  All other systems reviewed and are negative.    Physical Exam Updated Vital Signs BP 110/76 (BP Location: Right Arm)    Pulse 90    Temp 98.2 F (36.8 C) (Oral)    Resp 18    Ht 5\' 4"  (1.626 m)    Wt 63.5 kg    SpO2 98%    BMI 24.03 kg/m   Physical Exam Vitals signs and nursing note reviewed.  Constitutional:      General: She is not in acute distress.    Appearance: She is well-developed. She is not diaphoretic.  HENT:     Head: Normocephalic and atraumatic.  Neck:     Musculoskeletal: Normal range of motion and neck supple.  Cardiovascular:     Rate and Rhythm: Normal rate and regular rhythm.     Heart sounds: No murmur. No friction rub. No gallop.   Pulmonary:     Effort: Pulmonary effort is normal. No respiratory distress.     Breath sounds: Normal breath sounds. No wheezing.  Abdominal:     General: Bowel sounds are normal. There is no distension.     Palpations: Abdomen is soft.     Tenderness: There is abdominal tenderness in the right lower quadrant. There is no right CVA tenderness, left CVA tenderness, guarding or rebound.  Musculoskeletal: Normal range of motion.   Skin:    General: Skin is warm and dry.  Neurological:     Mental Status: She is alert and oriented to person, place, and time.      ED Treatments / Results  Labs (all labs ordered  are listed, but only abnormal results are displayed) Labs Reviewed  COMPREHENSIVE METABOLIC PANEL - Abnormal; Notable for the following components:      Result Value   Glucose, Bld 104 (*)    AST 13 (*)    All other components within normal limits  LIPASE, BLOOD  CBC  URINALYSIS, ROUTINE W REFLEX MICROSCOPIC  I-STAT BETA HCG BLOOD, ED ([facility], [facility], [facility] ONLY)    EKG    Radiology No results found.  Procedures Procedures (including critical care time)  Medications Ordered in ED Medications  sodium chloride flush (NS) 0.9 % injection 3 mL (3 mLs Intravenous Not Given 03/16/19 2237)     Initial Impression / Assessment and Plan / ED Course  I have reviewed the triage vital signs and the nursing notes.  Pertinent labs & imaging results that were available during my care of the patient were reviewed by me and considered in my medical decision making (see chart for details).  Patient presenting here with complaints of right lower quadrant pain for the past 2 days.  She is tender in the right lower quadrant, however there are no peritoneal signs.  She has no fever, no white count, and vitals are stable.  CT scan of the abdomen shows no acute findings, specifically no appendicitis or ovarian abnormality.  Patient's presentation and exam not consistent with torsion and I highly doubt this possibility.  Patient will be discharged with magnesium citrate as there appears to be increased stool throughout the colon.  She is to return as needed if pain worsens, she becomes febrile, or has bloody stool.  Final Clinical Impressions(s) / ED Diagnoses   Final diagnoses:  None    ED Discharge Orders    None       Geoffery Lyonselo, Broly Hatfield, MD 03/17/19 0021

## 2019-03-16 NOTE — ED Triage Notes (Signed)
Pt in POV reporting RLQ abd pain, nausea, diarrhea X 3 days

## 2019-03-17 NOTE — Discharge Instructions (Addendum)
Magnesium citrate: Drink the entire 10 ounce bottle mixed with equal parts Sprite or Gatorade for relief of constipation.  Ibuprofen 600 mg every 6 hours as needed for pain.  Follow-up with primary doctor if not improving in the next few days, and return to the ER if you develop worsening pain, high fever, bloody stool, or other new and concerning symptoms.

## 2019-06-05 ENCOUNTER — Ambulatory Visit (HOSPITAL_COMMUNITY)
Admission: EM | Admit: 2019-06-05 | Discharge: 2019-06-05 | Disposition: A | Discharge status: Home / Self Care | Payer: Medicaid Other | Attending: Physician Assistant | Admitting: Physician Assistant

## 2019-06-05 ENCOUNTER — Other Ambulatory Visit: Payer: Self-pay

## 2019-06-05 ENCOUNTER — Encounter (HOSPITAL_COMMUNITY): Payer: Self-pay | Admitting: Emergency Medicine

## 2019-06-05 DIAGNOSIS — F209 Schizophrenia, unspecified: Secondary | ICD-10-CM | POA: Diagnosis not present

## 2019-06-05 DIAGNOSIS — Z1159 Encounter for screening for other viral diseases: Secondary | ICD-10-CM | POA: Diagnosis not present

## 2019-06-05 DIAGNOSIS — F319 Bipolar disorder, unspecified: Secondary | ICD-10-CM | POA: Insufficient documentation

## 2019-06-05 DIAGNOSIS — Z20828 Contact with and (suspected) exposure to other viral communicable diseases: Secondary | ICD-10-CM | POA: Diagnosis not present

## 2019-06-05 DIAGNOSIS — Z79899 Other long term (current) drug therapy: Secondary | ICD-10-CM | POA: Diagnosis not present

## 2019-06-05 DIAGNOSIS — J069 Acute upper respiratory infection, unspecified: Secondary | ICD-10-CM | POA: Diagnosis not present

## 2019-06-05 DIAGNOSIS — M1990 Unspecified osteoarthritis, unspecified site: Secondary | ICD-10-CM | POA: Diagnosis not present

## 2019-06-05 DIAGNOSIS — Z87891 Personal history of nicotine dependence: Secondary | ICD-10-CM | POA: Insufficient documentation

## 2019-06-05 LAB — POCT RAPID STREP A: Streptococcus, Group A Screen (Direct): NEGATIVE

## 2019-06-05 NOTE — ED Triage Notes (Signed)
Pt reports ST, cough, nasal congestion, and bilaterl ear fullness x1 week. She also reports , N, D, and loss of taste and smell.  She denies any fever or SOB,

## 2019-06-05 NOTE — ED Provider Notes (Signed)
[facility]    CSN: [id number] Arrival date & time: 06/05/19  [id number]      History   Chief Complaint Chief Complaint  Patient presents with  . URI    HPI Cheryl [name] is a 33 y.o. female.   The history is provided by the patient. No language interpreter was used.  URI Presenting symptoms: congestion and cough   Severity:  Moderate Onset quality:  Gradual Timing:  Constant Progression:  Worsening Chronicity:  New Relieved by:  Nothing Worsened by:  Nothing Ineffective treatments:  None tried Risk factors: recent illness   Pt complains of loss of smell and sore throat.  Pt has been sick for a week.  Pt denies any known covid exposure  Past Medical History:  Diagnosis Date  . Abnormal Pap smear   . ADHD   . Arthritis    bilateral jaws  . Asthma    as a child  . Bipolar 1 disorder ([facility])   . Depression   . GERD (gastroesophageal reflux disease)    with pregnancy  . Headache   . HSV infection   . PTSD (post-traumatic stress disorder)   . Schizophrenia [facility](HCC)     Patient Active Problem List   Diagnosis Date Noted  . S/P cesarean section 10/16/2015  . S/P repeat low transverse C-section 04/14/2014    Past Surgical History:  Procedure Laterality Date  . CESAREAN SECTION  04/30/2012   Procedure: CESAREAN SECTION;  Surgeon: Antionette CharLisa Jackson-Moore, MD;  Location: [facility];  Service: Gynecology;  Laterality: N/A;  Primary cesarean section of baby girl at 2113   APGAR  9/9  . CESAREAN SECTION N/A 04/14/2014   Procedure: CESAREAN SECTION;  Surgeon: Oliver PilaKathy W Richardson, MD;  Location: [facility];  Service: Obstetrics;  Laterality: N/A;  . CESAREAN SECTION N/A 10/16/2015   Procedure: CESAREAN SECTION;  Surgeon: Sherian ReinJody [name], MD;  Location: [facility];  Service: Obstetrics;  Laterality: N/A;  . COLPOSCOPY W/ BIOPSY / CURETTAGE    . TUBAL LIGATION Bilateral 10/16/2015   Procedure: BILATERAL TUBAL LIGATION;  Surgeon: Sherian ReinJody [name], MD;  Location: [facility];  Service:  Obstetrics;  Laterality: Bilateral;  . WISDOM TOOTH EXTRACTION      OB History    Gravida  3   Para  3   Term  3   Preterm      AB      Living  3     SAB      TAB      Ectopic      Multiple  0   Live Births  3            Home Medications    Prior to Admission medications   Medication Sig Start Date End Date Taking? Authorizing Provider  ibuprofen (ADVIL,MOTRIN) 600 MG tablet Take 1 tablet (600 mg total) by mouth every 6 (six) hours as needed for mild pain. 04/26/16  Yes Earley FavorSchulz, Gail, NP  methocarbamol (ROBAXIN) 750 MG tablet Take 1-2 tablets (750-1,500 mg total) by mouth 3 (three) times daily as needed for muscle spasms. 02/28/18  Yes Cristina GongHammond, Elizabeth W, PA-C  calcium carbonate (TUMS - DOSED IN MG ELEMENTAL CALCIUM) 500 MG chewable tablet Chew 2 tablets by mouth daily as needed for indigestion or heartburn.    [provider]  ciprofloxacin (CIPRO) 500 MG tablet Take 1 tablet (500 mg total) by mouth every 12 (twelve) hours. 10/29/16   Garlon HatchetSanders, Lisa M, PA-C  ibuprofen (ADVIL,MOTRIN) 600 MG tablet  Take 1 tablet (600 mg total) by mouth every 6 (six) hours as needed. 04/26/16   Junius Creamer, NP  ondansetron (ZOFRAN ODT) 4 MG disintegrating tablet Take 1 tablet (4 mg total) by mouth every 8 (eight) hours as needed for nausea or vomiting. 04/26/16   Junius Creamer, NP  oxyCODONE-acetaminophen (PERCOCET/ROXICET) 5-325 MG tablet Take 1 tablet by mouth every 4 (four) hours as needed for severe pain. 10/19/15   Sherlyn Hay, DO  Prenatal Vit-Fe Fumarate-FA (PRENATAL MULTIVITAMIN) TABS tablet Take 1 tablet by mouth daily at 12 noon.    [provider]    Family History Family History  Problem Relation Age of Onset  . Other Neg Hx     Social History Social History   Tobacco Use  . Smoking status: Former Smoker    Packs/day: 1.00    Years: 3.00    Pack years: 3.00    Types: Cigarettes    Quit date: 10/30/2010    Years since quitting: 8.6  .  Smokeless tobacco: Never Used  Substance Use Topics  . Alcohol use: Yes    Comment: Socially   . Drug use: Not Currently    Comment: Rx for Suboxone     Allergies   Patient has no known allergies.   Review of Systems Review of Systems  HENT: Positive for congestion.   Respiratory: Positive for cough.   All other systems reviewed and are negative.    Physical Exam Triage Vital Signs ED Triage Vitals  Enc Vitals Group     BP 06/05/19 1028 101/69     Pulse Rate 06/05/19 1028 69     Resp 06/05/19 1028 18     Temp 06/05/19 1028 97.7 F (36.5 C)     Temp Source 06/05/19 1028 Temporal     SpO2 06/05/19 1028 100 %     Weight --      Height --      Head Circumference --      Peak Flow --      Pain Score 06/05/19 1021 6     Pain Loc --      Pain Edu? --      Excl. in [place]? --    No data found.  Updated Vital Signs BP 101/69 (BP Location: Left Arm)   Pulse 69   Temp 97.7 F (36.5 C) (Temporal)   Resp 18   LMP 05/30/2019 (Approximate)   SpO2 100%   Visual Acuity Right Eye Distance:   Left Eye Distance:   Bilateral Distance:    Right Eye Near:   Left Eye Near:    Bilateral Near:     Physical Exam Vitals signs reviewed.  HENT:     Head: Normocephalic.     Right Ear: Tympanic membrane normal.     Left Ear: Tympanic membrane normal.     Nose: Congestion present.  Eyes:     Pupils: Pupils are equal, round, and reactive to light.  Neck:     Musculoskeletal: Normal range of motion.  Cardiovascular:     Rate and Rhythm: Normal rate.  Pulmonary:     Effort: Pulmonary effort is normal.  Abdominal:     General: Abdomen is flat.  Musculoskeletal: Normal range of motion.  Skin:    General: Skin is warm.  Neurological:     General: No focal deficit present.     Mental Status: She is alert.  Psychiatric:        Mood and Affect: Mood  normal.      UC Treatments / Results  Labs (all labs ordered are listed, but only abnormal results are displayed) Labs  Reviewed  NOVEL CORONAVIRUS, NAA (HOSP ORDER, SEND-OUT TO REF LAB; TAT 18-24 HRS)  CULTURE, GROUP A STREP [facility])  POCT RAPID STREP A    EKG   Radiology No results found.  Procedures Procedures (including critical care time)  Medications Ordered in UC Medications - No data to display  Initial Impression / Assessment and Plan / UC Course  I have reviewed the triage vital signs and the nursing notes.  Pertinent labs & imaging results that were available during my care of the patient were reviewed by me and considered in my medical decision making (see chart for details).     MDM   Strep is negative,  I suspect pt may have covid.  Pt advised to monitor symptoms. Quarantine  Final Clinical Impressions(s) / UC Diagnoses   Final diagnoses:  Upper respiratory tract infection, unspecified type     Discharge Instructions     Your Covid test is pending.  It will return in 2-3 days.  Tylenol for fevers.  Return or go to ED if symptoms worsen or cahnge.    ED Prescriptions    None     Controlled Substance Prescriptions [place] Controlled Substance Registry consulted? Not Applicable  An After Visit Summary was printed and given to the patient.    Elson Areas, [place] 06/05/19 1148

## 2019-06-05 NOTE — Discharge Instructions (Signed)
Your Covid test is pending.  It will return in 2-3 days.  Tylenol for fevers.  Return or go to ED if symptoms worsen or cahnge.

## 2019-06-06 ENCOUNTER — Encounter (HOSPITAL_COMMUNITY): Payer: Self-pay

## 2019-06-06 LAB — NOVEL CORONAVIRUS, NAA (HOSP ORDER, SEND-OUT TO REF LAB; TAT 18-24 HRS): SARS-CoV-2, NAA: NOT DETECTED

## 2019-06-07 LAB — CULTURE, GROUP A STREP (THRC)

## 2020-06-23 ENCOUNTER — Encounter (HOSPITAL_COMMUNITY): Payer: Self-pay | Admitting: *Deleted

## 2020-06-23 ENCOUNTER — Other Ambulatory Visit: Payer: Self-pay

## 2020-06-23 ENCOUNTER — Emergency Department (HOSPITAL_COMMUNITY)
Admission: EM | Admit: 2020-06-23 | Discharge: 2020-06-23 | Disposition: A | Discharge status: Home / Self Care | Payer: Medicaid Other | Attending: Emergency Medicine | Admitting: Emergency Medicine

## 2020-06-23 DIAGNOSIS — Z5321 Procedure and treatment not carried out due to patient leaving prior to being seen by health care provider: Secondary | ICD-10-CM | POA: Insufficient documentation

## 2020-06-23 DIAGNOSIS — F1120 Opioid dependence, uncomplicated: Secondary | ICD-10-CM | POA: Diagnosis present

## 2020-06-23 LAB — ETHANOL: Alcohol, Ethyl (B): <10 mg/dL (ref <10)

## 2020-06-23 LAB — CBC
HCT: 44.5 % (ref 36.0–46.0)
Hemoglobin: 13.6 g/dL (ref 12.0–15.0)
MCH: 26.7 pg (ref 26.0–34.0)
MCHC: 30.6 g/dL (ref 30.0–36.0)
MCV: 87.3 fL (ref 80.0–100.0)
Platelets: 228 10*3/uL (ref 150–400)
RBC: 5.1 MIL/uL (ref 3.87–5.11)
RDW: 13.8 % (ref 11.5–15.5)
WBC: 7.5 10*3/uL (ref 4.0–10.5)
nRBC: 0 % (ref 0.0–0.2)

## 2020-06-23 LAB — COMPREHENSIVE METABOLIC PANEL
ALT: 33 U/L (ref 0–44)
AST: 23 U/L (ref 15–41)
Albumin: 3.5 g/dL (ref 3.5–5.0)
Alkaline Phosphatase: 77 U/L (ref 38–126)
Anion gap: 9 (ref 5–15)
BUN: 5 mg/dL — ABNORMAL LOW (ref 6–20)
CO2: 27 mmol/L (ref 22–32)
Calcium: 9.4 mg/dL (ref 8.9–10.3)
Chloride: 103 mmol/L (ref 98–111)
Creatinine, Ser: 0.59 mg/dL (ref 0.44–1.00)
GFR calc Af Amer: >60 mL/min (ref >60)
GFR calc non Af Amer: >60 mL/min (ref >60)
Glucose, Bld: 91 mg/dL (ref 70–99)
Potassium: 3.8 mmol/L (ref 3.5–5.1)
Sodium: 139 mmol/L (ref 135–145)
Total Bilirubin: 0.4 mg/dL (ref 0.3–1.2)
Total Protein: 7.3 g/dL (ref 6.5–8.1)

## 2020-06-23 LAB — RAPID URINE DRUG SCREEN, HOSP PERFORMED
Amphetamines: NOT DETECTED
Barbiturates: NOT DETECTED
Benzodiazepines: NOT DETECTED
Cocaine: NOT DETECTED
Opiates: POSITIVE — AB
Tetrahydrocannabinol: POSITIVE — AB

## 2020-06-23 LAB — I-STAT BETA HCG BLOOD, ED (MC, WL, AP ONLY): I-stat hCG, quantitative: <5 m[IU]/mL (ref <5)

## 2020-06-23 NOTE — ED Notes (Signed)
Pt is not found in waiting room, caleld x3 for vitals

## 2020-06-23 NOTE — ED Triage Notes (Signed)
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 Denies SI or HI.

## 2020-08-04 IMAGING — CT CT ABDOMEN AND PELVIS WITH CONTRAST
2 of 4 series · 16 of 46 positions shown, 18 images · IV contrast (APPLIED)
Comparison: None.

CLINICAL DATA: Right lower quadrant pain

EXAM:
CT ABDOMEN AND PELVIS WITH CONTRAST
TECHNIQUE: Multidetector CT imaging of the abdomen and pelvis was performed
using the standard protocol following bolus administration of
intravenous contrast.
CONTRAST:  100mL OMNIPAQUE IOHEXOL 300 MG/ML  SOLN

[Series 3: abd/ pelvis 5.0 i30f 2 · axial · 0.95mm/px · z∈[-643,-223]mm · 13 of 94 slices shown, 15 images]
[im 5/94  soft-tissue]
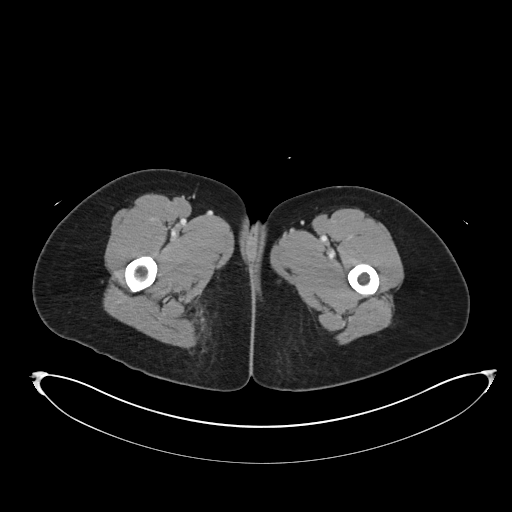
[im 5/94  bone]
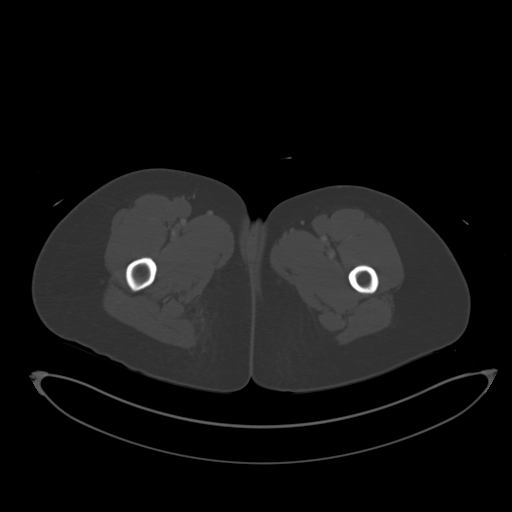
[im 13/94  soft-tissue]
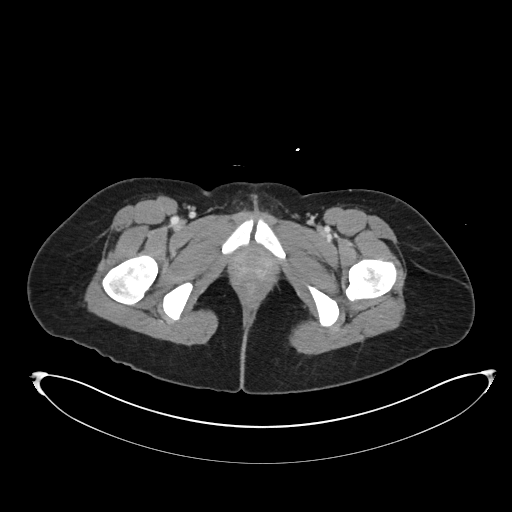
[im 21/94  soft-tissue]
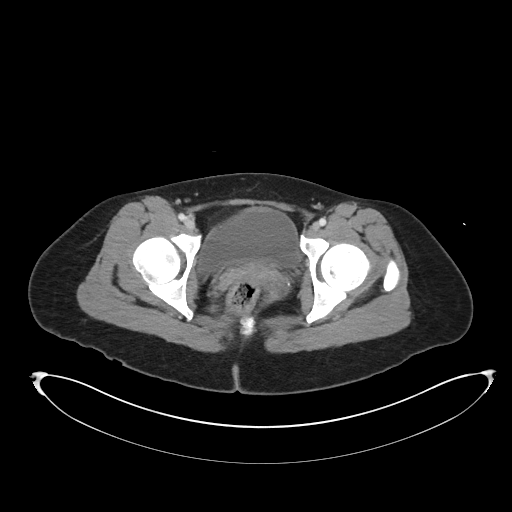
[im 25/94  soft-tissue]
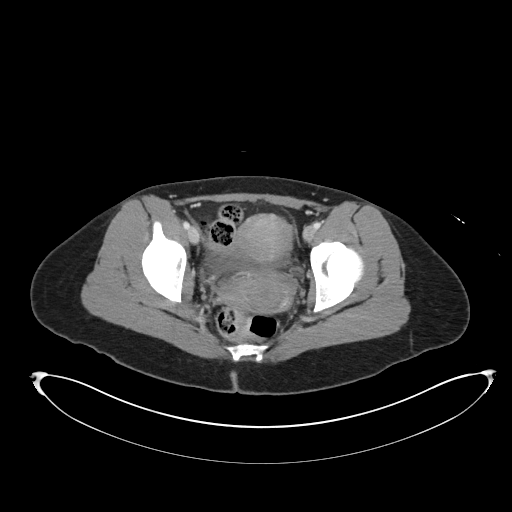
[im 33/94  soft-tissue]
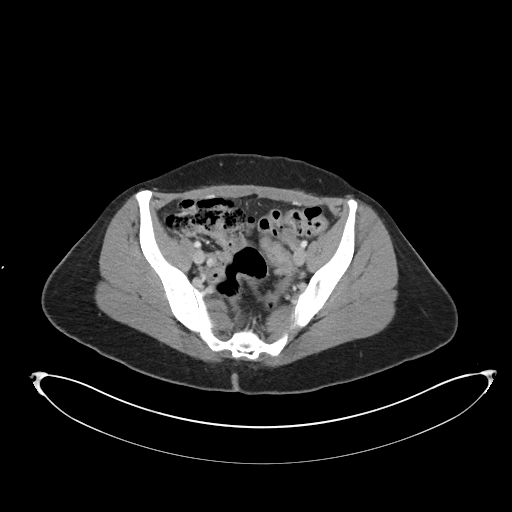
[im 41/94  soft-tissue]
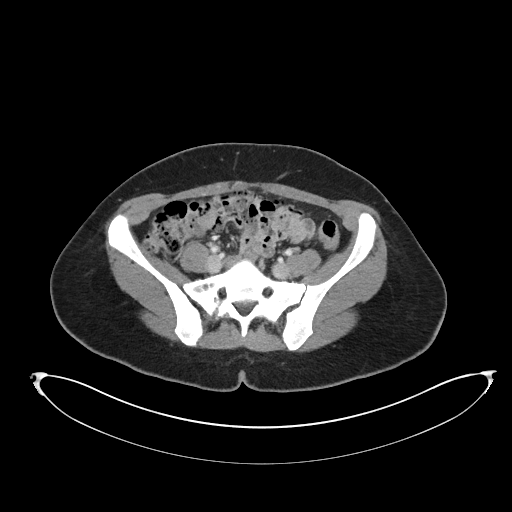
[im 49/94  soft-tissue]
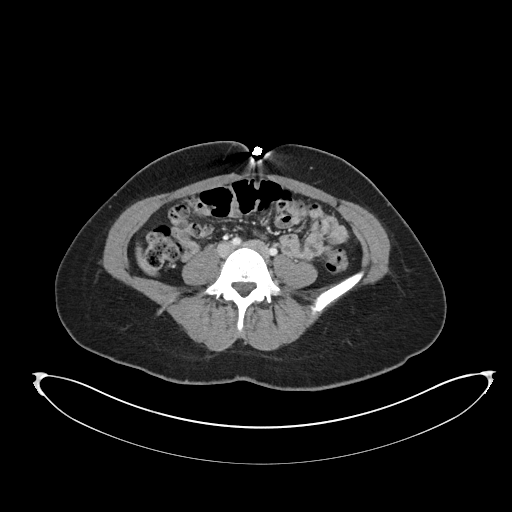
[im 53/94  soft-tissue]
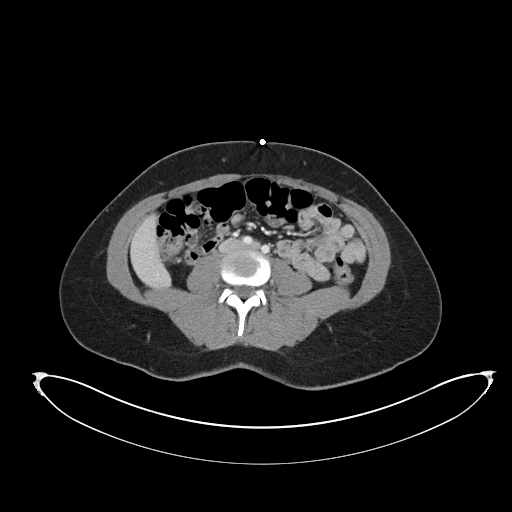
[im 61/94  soft-tissue]
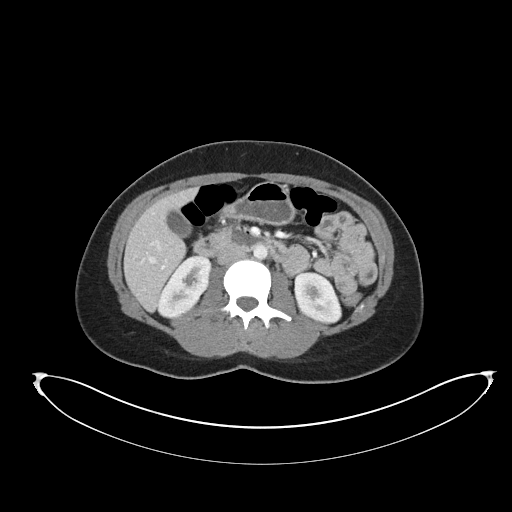
[im 61/94  bone]
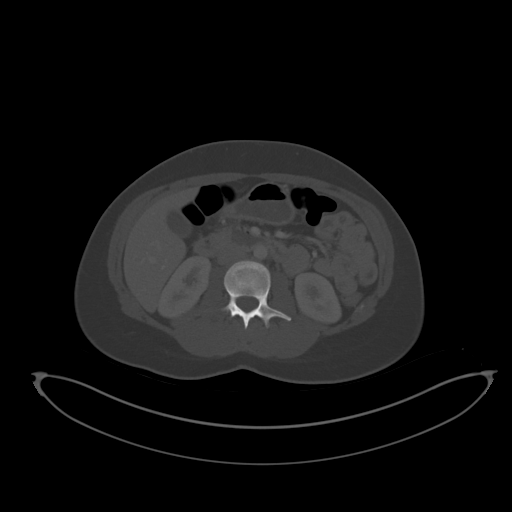
[im 69/94  soft-tissue]
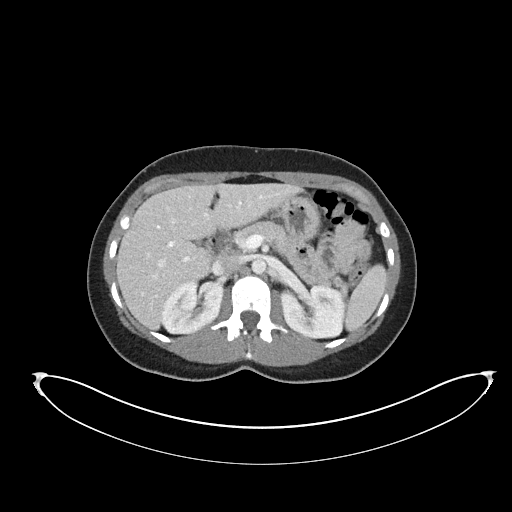
[im 73/94  soft-tissue]
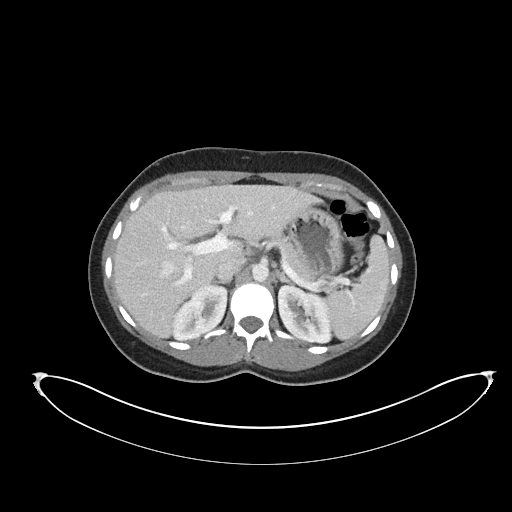
[im 81/94  soft-tissue]
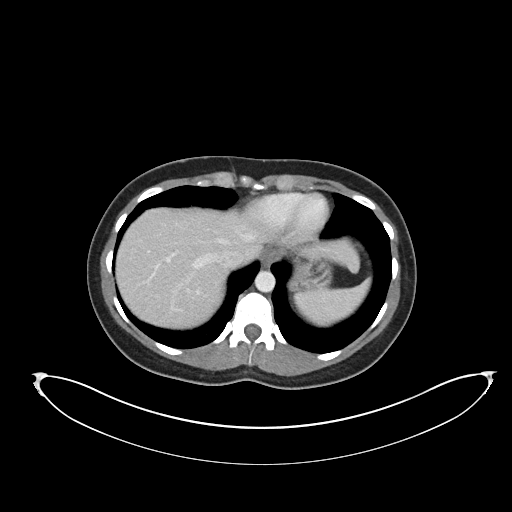
[im 89/94  soft-tissue]
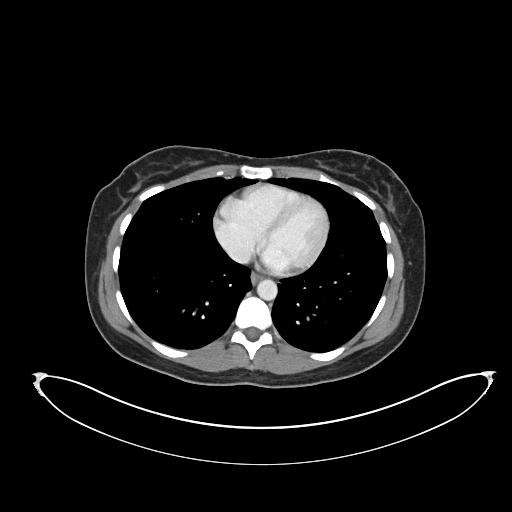

[Series 6: coronal soft tissue · coronal · 0.79mm/px · 3 of 93 slices shown]
[im 31/93  soft-tissue]
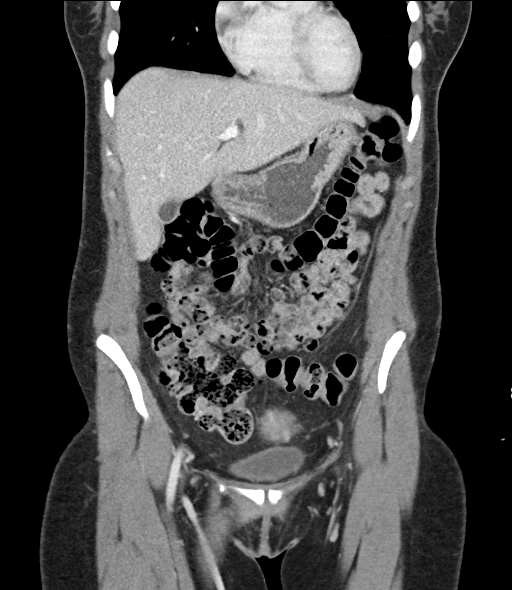
[im 41/93  soft-tissue]
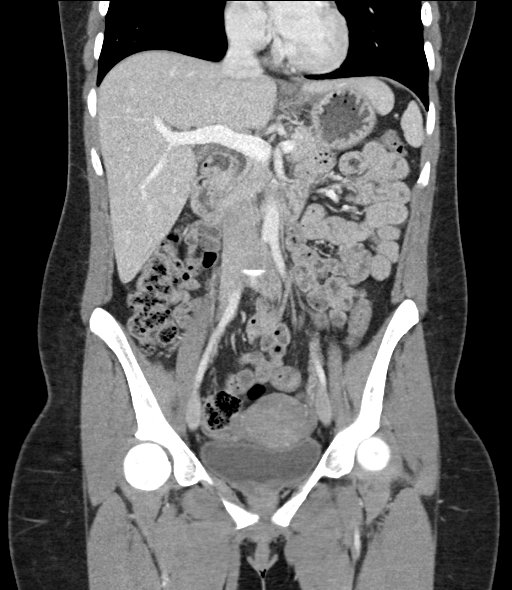
[im 52/93  soft-tissue]
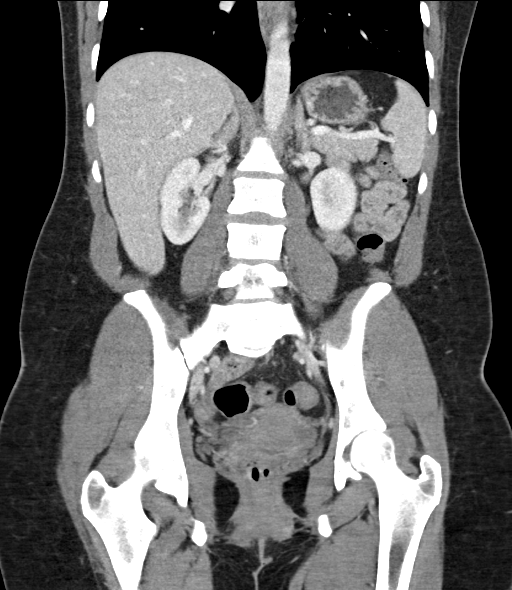

[16 of 46 positions shown; findings below may reference images not displayed]

FINDINGS: Lower chest: No acute abnormality.

Hepatobiliary: No focal liver abnormality is seen. No gallstones,
gallbladder wall thickening, or biliary dilatation.

Pancreas: Unremarkable. No pancreatic ductal dilatation or
surrounding inflammatory changes.

Spleen: Normal in size without focal abnormality.

Adrenals/Urinary Tract: Adrenal glands are unremarkable. Kidneys are
normal, without renal calculi, focal lesion, or hydronephrosis.
Bladder is unremarkable.

Stomach/Bowel: Stomach is within normal limits. Appendix appears
normal. No evidence of bowel wall thickening, distention, or
inflammatory changes.

Vascular/Lymphatic: No significant vascular findings are present. No
enlarged abdominal or pelvic lymph nodes.

Reproductive: Uterus and bilateral adnexa are unremarkable. There is
a probable corpus luteal cyst in the right ovary.

Other: No abdominal wall hernia or abnormality. No abdominopelvic
ascites.

Musculoskeletal: No acute or significant osseous findings.
IMPRESSION: No acute abnormality. No findings to explain the patient's right
lower quadrant pain. The appendix is normal and located in the right
lower quadrant.

## 2020-08-26 ENCOUNTER — Telehealth (HOSPITAL_COMMUNITY): Payer: Medicaid Other

## 2020-10-15 ENCOUNTER — Emergency Department (HOSPITAL_COMMUNITY)
Admission: EM | Admit: 2020-10-15 | Discharge: 2020-10-15 | Disposition: A | Discharge status: Home / Self Care | Payer: Medicaid Other | Attending: Physician Assistant | Admitting: Physician Assistant

## 2020-10-15 ENCOUNTER — Encounter (HOSPITAL_COMMUNITY): Payer: Self-pay | Admitting: Emergency Medicine

## 2020-10-15 ENCOUNTER — Other Ambulatory Visit: Payer: Self-pay

## 2020-10-15 DIAGNOSIS — U071 COVID-19: Secondary | ICD-10-CM | POA: Insufficient documentation

## 2020-10-15 DIAGNOSIS — J45909 Unspecified asthma, uncomplicated: Secondary | ICD-10-CM | POA: Diagnosis not present

## 2020-10-15 DIAGNOSIS — Z87891 Personal history of nicotine dependence: Secondary | ICD-10-CM | POA: Diagnosis not present

## 2020-10-15 DIAGNOSIS — R059 Cough, unspecified: Secondary | ICD-10-CM

## 2020-10-15 MED ORDER — ALBUTEROL SULFATE HFA 108 (90 BASE) MCG/ACT IN AERS: 1.0000 | INHALATION_SPRAY | Freq: Four times a day (QID) | RESPIRATORY_TRACT | 0 refills | Status: DC | PRN

## 2020-10-15 NOTE — ED Provider Notes (Signed)
[facility] EMERGENCY DEPARTMENT Provider Note   CSN: [id number] Arrival date & time: 10/15/20  1328     History Chief Complaint  Patient presents with  . Covid Positive    Cheryl [name] is a 35 y.o. female.  HPI  35 year old female who presents to the ER with complaints of cough with productive mucus in the setting of a COVID-19 positive test on 1/6.  Also has some exertional shortness of breath and weakness.  States it is so bad that she "cannot even smoke her cigarettes".  She has been taking DayQuil and NyQuil with little relief.  She has been coughing up thick green mucus.  Denies any chest pain.  Denies any nausea, vomiting, Donnell pain, weakness.  She is not vaccinated.     Past Medical History:  Diagnosis Date  . Abnormal Pap smear   . ADHD   . Arthritis    bilateral jaws  . Asthma    as a child  . Bipolar 1 disorder (HCC)   . Depression   . GERD (gastroesophageal reflux disease)    with pregnancy  . Headache   . HSV infection   . PTSD (post-traumatic stress disorder)   . Schizophrenia [facility])     Patient Active Problem List   Diagnosis Date Noted  . S/P cesarean section 10/16/2015  . S/P repeat low transverse C-section 04/14/2014    Past Surgical History:  Procedure Laterality Date  . CESAREAN SECTION  04/30/2012   Procedure: CESAREAN SECTION;  Surgeon: Antionette Char, MD;  Location: [facility];  Service: Gynecology;  Laterality: N/A;  Primary cesarean section of baby girl at 2113   APGAR  9/9  . CESAREAN SECTION N/A 04/14/2014   Procedure: CESAREAN SECTION;  Surgeon: Oliver Pila, MD;  Location: [facility];  Service: Obstetrics;  Laterality: N/A;  . CESAREAN SECTION N/A 10/16/2015   Procedure: CESAREAN SECTION;  Surgeon: Sherian Rein, MD;  Location: [facility];  Service: Obstetrics;  Laterality: N/A;  . COLPOSCOPY W/ BIOPSY / CURETTAGE    . TUBAL LIGATION Bilateral 10/16/2015   Procedure: BILATERAL TUBAL LIGATION;  Surgeon: Sherian Rein, MD;  Location: [facility];  Service: Obstetrics;  Laterality: Bilateral;  . WISDOM TOOTH EXTRACTION       OB History    Gravida  3   Para  3   Term  3   Preterm      AB      Living  3     SAB      IAB      Ectopic      Multiple  0   Live Births  3           Family History  Problem Relation Age of Onset  . Other Neg Hx     Social History   Tobacco Use  . Smoking status: Former Smoker    Packs/day: 1.00    Years: 3.00    Pack years: 3.00    Types: Cigarettes    Quit date: 10/30/2010    Years since quitting: 9.9  . Smokeless tobacco: Never Used  Vaping Use  . Vaping Use: Never used  Substance Use Topics  . Alcohol use: Yes    Comment: Socially   . Drug use: Not Currently    Comment: Rx for Suboxone    Home Medications Prior to Admission medications   Medication Sig Start Date End Date Taking? Authorizing Provider  albuterol (VENTOLIN HFA) 108 (90 Base) MCG/ACT  inhaler Inhale 1-2 puffs into the lungs every 6 (six) hours as needed for wheezing or shortness of breath. 10/15/20  Yes Mare Ferrari, PA-C  calcium carbonate (TUMS - DOSED IN MG ELEMENTAL CALCIUM) 500 MG chewable tablet Chew 2 tablets by mouth daily as needed for indigestion or heartburn.    [provider]  ciprofloxacin (CIPRO) 500 MG tablet Take 1 tablet (500 mg total) by mouth every 12 (twelve) hours. 10/29/16   Garlon Hatchet, PA-C  ibuprofen (ADVIL,MOTRIN) 600 MG tablet Take 1 tablet (600 mg total) by mouth every 6 (six) hours as needed. 04/26/16   Earley Favor, NP  ibuprofen (ADVIL,MOTRIN) 600 MG tablet Take 1 tablet (600 mg total) by mouth every 6 (six) hours as needed for mild pain. 04/26/16   Earley Favor, NP  methocarbamol (ROBAXIN) 750 MG tablet Take 1-2 tablets (750-1,500 mg total) by mouth 3 (three) times daily as needed for muscle spasms. 02/28/18   Cristina Gong, PA-C  ondansetron (ZOFRAN ODT) 4 MG disintegrating tablet Take 1 tablet (4 mg total) by  mouth every 8 (eight) hours as needed for nausea or vomiting. 04/26/16   Earley Favor, NP  oxyCODONE-acetaminophen (PERCOCET/ROXICET) 5-325 MG tablet Take 1 tablet by mouth every 4 (four) hours as needed for severe pain. 10/19/15   Edwinna Areola, DO  Prenatal Vit-Fe Fumarate-FA (PRENATAL MULTIVITAMIN) TABS tablet Take 1 tablet by mouth daily at 12 noon.    [provider]    Allergies    Patient has no known allergies.  Review of Systems   Review of Systems  Constitutional: Positive for activity change, appetite change, chills, fatigue and fever.  Respiratory: Positive for cough and shortness of breath.   Cardiovascular: Negative for chest pain.  Gastrointestinal: Negative for abdominal pain, diarrhea, nausea and vomiting.  Genitourinary: Negative for dysuria.  Neurological: Positive for weakness.    Physical Exam Updated Vital Signs BP 101/69 (BP Location: Right Arm)   Pulse 84   Temp 97.9 F (36.6 C) (Oral)   Resp 15   Ht 5\' 4"  (1.626 m)   Wt 54.4 kg   SpO2 99%   BMI 20.59 kg/m   Physical Exam Vitals and nursing note reviewed.  Constitutional:      General: She is not in acute distress.    Appearance: She is well-developed and well-nourished. She is not ill-appearing or diaphoretic.  HENT:     Head: Normocephalic and atraumatic.  Eyes:     Conjunctiva/sclera: Conjunctivae normal.  Cardiovascular:     Rate and Rhythm: Normal rate and regular rhythm.     Pulses: Normal pulses.     Heart sounds: Normal heart sounds. No murmur heard.   Pulmonary:     Effort: Pulmonary effort is normal. No respiratory distress.     Breath sounds: Normal breath sounds.  Abdominal:     Palpations: Abdomen is soft.     Tenderness: There is no abdominal tenderness.  Musculoskeletal:        General: No edema. Normal range of motion.     Cervical back: Neck supple.  Skin:    General: Skin is warm and dry.  Neurological:     General: No focal deficit present.     Mental  Status: She is alert and oriented to person, place, and time.  Psychiatric:        Mood and Affect: Mood and affect and mood normal.        Behavior: Behavior normal.  ED Results / Procedures / Treatments   Labs (all labs ordered are listed, but only abnormal results are displayed) Labs Reviewed - No data to display  EKG None  Radiology No results found.  Procedures Procedures (including critical care time)  Medications Ordered in ED Medications - No data to display  ED Course  I have reviewed the triage vital signs and the nursing notes.  Pertinent labs & imaging results that were available during my care of the patient were reviewed by me and considered in my medical decision making (see chart for details).    MDM Rules/Calculators/A&P                         35 year old female with complaints of coughing up thick mucus in the setting of COVID-19.  Vitals overall very reassuring.  Well-appearing, no tachypnea, tachycardic, speaking full sentences without increased work of breathing.  Tolerating secretions well.  I explained to the patient that she likely has a viral pneumonia in the setting of COVID-19.  Discussed supportive measures including Mucinex, TheraFlu, and other over-the-counter medications.  Patient endorses a history of asthma and feels like she has been wheezing.  We will send in an inhaler.  Encouraged PCP follow-up.  Return precautions discussed.  She voiced understanding and is agreeable.  Low suspicion for PE, and bacterial pneumonia, or any life-threatening causes of her symptoms.  Will for discharge at this time. Final Clinical Impression(s) / ED Diagnoses Final diagnoses:  COVID-19  Cough    Rx / DC Orders ED Discharge Orders         Ordered    albuterol (VENTOLIN HFA) 108 (90 Base) MCG/ACT inhaler  Every 6 hours PRN        10/15/20 1508           Leone Brand 10/15/20 1516    Mancel Bale, MD 10/16/20 1145

## 2020-10-15 NOTE — Discharge Instructions (Addendum)
You may take over-the-counter Mucinex, TheraFlu, hot steam to help with your symptoms.  Use the inhaler as needed.  You may buy an over the counter pulse oximeter read to the oxygen levels and blood, return to the ER if your levels are consistently below 90%. Please call the phone number on the discharge paperwork to establish with a primary care doctor to follow up about your symptoms

## 2020-10-15 NOTE — ED Triage Notes (Signed)
Pt arrives to ED with c/o of continued congestion for COVID. Pt was tested positive last Thursday 1/6. States so much mucous  That she gets SOB at times.

## 2021-03-20 ENCOUNTER — Encounter (HOSPITAL_COMMUNITY): Payer: Self-pay

## 2021-03-20 ENCOUNTER — Ambulatory Visit (HOSPITAL_COMMUNITY)
Admission: EM | Admit: 2021-03-20 | Discharge: 2021-03-20 | Disposition: A | Discharge status: Home / Self Care | Payer: Medicaid Other | Attending: Physician Assistant | Admitting: Physician Assistant

## 2021-03-20 ENCOUNTER — Other Ambulatory Visit: Payer: Self-pay

## 2021-03-20 DIAGNOSIS — Z113 Encounter for screening for infections with a predominantly sexual mode of transmission: Secondary | ICD-10-CM | POA: Diagnosis not present

## 2021-03-20 DIAGNOSIS — N898 Other specified noninflammatory disorders of vagina: Secondary | ICD-10-CM | POA: Insufficient documentation

## 2021-03-20 HISTORY — DX: Unspecified temporomandibular joint disorder, unspecified side: M26.609

## 2021-03-20 MED ORDER — METRONIDAZOLE 500 MG PO TABS: 500.0000 mg | ORAL_TABLET | Freq: Two times a day (BID) | ORAL | 0 refills | Status: DC

## 2021-03-20 NOTE — ED Triage Notes (Signed)
Pt reports white vaginal discharge and fishy odor for about a month. Has tried vagisil and monistat with no relief. Noted symptoms got worse after using vaginal suppository to treat odor.

## 2021-03-20 NOTE — ED Provider Notes (Signed)
[facility]    CSN: [id number] Arrival date & time: 03/20/21  1227      History   Chief Complaint Chief Complaint  Patient presents with   Vaginal Discharge    HPI Cheryl [name] is a 35 y.o. female.   Patient presents today with a month-long history of vaginal discharge.  She describes discharge as copious, thin, malodorous.  She has tried over-the-counter suppositories to manage odor as well as vaginal cell and Monistat without improvement of symptoms.  She denies any abdominal pain, pelvic pain, vaginal pain, urinary symptoms, nausea, vomiting, fever.  Denies any recent antibiotic use.  Denies any medication changes.  Denies any changes to personal hygiene products including soaps or detergents.  She is sexually active with female partners and does report new partners in the past several months.  She is open to STI testing today.  She has no concern for pregnancy.   Past Medical History:  Diagnosis Date   Abnormal Pap smear    ADHD    Arthritis    bilateral jaws   Asthma    as a child   Bipolar 1 disorder (HCC)    Depression    GERD (gastroesophageal reflux disease)    with pregnancy   Headache    HSV infection    PTSD (post-traumatic stress disorder)    Schizophrenia (HCC)    TMJ (temporomandibular joint disorder)     Patient Active Problem List   Diagnosis Date Noted   S/P cesarean section 10/16/2015   S/P repeat low transverse C-section 04/14/2014    Past Surgical History:  Procedure Laterality Date   CESAREAN SECTION  04/30/2012   Procedure: CESAREAN SECTION;  Surgeon: Antionette Char, MD;  Location: [facility];  Service: Gynecology;  Laterality: N/A;  Primary cesarean section of baby girl at 2113   APGAR  9/9   CESAREAN SECTION N/A 04/14/2014   Procedure: CESAREAN SECTION;  Surgeon: Oliver Pila, MD;  Location: [facility];  Service: Obstetrics;  Laterality: N/A;   CESAREAN SECTION N/A 10/16/2015   Procedure: CESAREAN SECTION;  Surgeon: Sherian Rein, MD;  Location: [facility];  Service: Obstetrics;  Laterality: N/A;   COLPOSCOPY W/ BIOPSY / CURETTAGE     TUBAL LIGATION Bilateral 10/16/2015   Procedure: BILATERAL TUBAL LIGATION;  Surgeon: Sherian Rein, MD;  Location: [facility];  Service: Obstetrics;  Laterality: Bilateral;   WISDOM TOOTH EXTRACTION      OB History     Gravida  3   Para  3   Term  3   Preterm      AB      Living  3      SAB      IAB      Ectopic      Multiple  0   Live Births  3            Home Medications    Prior to Admission medications   Medication Sig Start Date End Date Taking? Authorizing Provider  albuterol (VENTOLIN HFA) 108 (90 Base) MCG/ACT inhaler Inhale 1-2 puffs into the lungs every 6 (six) hours as needed for wheezing or shortness of breath. 10/15/20  Yes Mare Ferrari, PA-C  Buprenorphine HCl-Naloxone HCl (SUBOXONE SL) Place under the tongue.   Yes [provider]  Citalopram Hydrobromide (CELEXA PO) Take by mouth.   Yes [provider]  cloNIDine (CATAPRES) 0.1 MG tablet Take 0.1 mg by mouth daily.   Yes [provider]  ibuprofen (ADVIL,MOTRIN) 600 MG tablet Take 1 tablet (600 mg total) by mouth every 6 (six) hours as needed. 04/26/16  Yes Earley Favor, NP  Lurasidone HCl (LATUDA PO) Take by mouth.   Yes [provider]  metroNIDAZOLE (FLAGYL) 500 MG tablet Take 1 tablet (500 mg total) by mouth 2 (two) times daily. 03/20/21  Yes Jenisha Faison K, PA-C  calcium carbonate (TUMS - DOSED IN MG ELEMENTAL CALCIUM) 500 MG chewable tablet Chew 2 tablets by mouth daily as needed for indigestion or heartburn.    [provider]  ibuprofen (ADVIL,MOTRIN) 600 MG tablet Take 1 tablet (600 mg total) by mouth every 6 (six) hours as needed for mild pain. 04/26/16   Earley Favor, NP  methocarbamol (ROBAXIN) 750 MG tablet Take 1-2 tablets (750-1,500 mg total) by mouth 3 (three) times daily as needed for muscle spasms. 02/28/18   Cristina Gong, PA-C  ondansetron (ZOFRAN ODT) 4 MG disintegrating tablet Take 1 tablet (4 mg total) by mouth every 8 (eight) hours as needed for nausea or vomiting. 04/26/16   Earley Favor, NP  oxyCODONE-acetaminophen (PERCOCET/ROXICET) 5-325 MG tablet Take 1 tablet by mouth every 4 (four) hours as needed for severe pain. 10/19/15   Edwinna Areola, DO  Prenatal Vit-Fe Fumarate-FA (PRENATAL MULTIVITAMIN) TABS tablet Take 1 tablet by mouth daily at 12 noon.    [provider]    Family History Family History  Problem Relation Age of Onset   Anxiety disorder Mother    Depression Mother    Celiac disease Mother    Diabetes Sister    Lupus Sister    Other Neg Hx     Social History Social History   Tobacco Use   Smoking status: Every Day    Packs/day: 1.00    Years: 3.00    Pack years: 3.00    Types: Cigarettes    Last attempt to quit: 10/30/2010    Years since quitting: 10.3   Smokeless tobacco: Never  Vaping Use   Vaping Use: Never used  Substance Use Topics   Alcohol use: Not Currently    Comment: Socially    Drug use: Not Currently    Comment: Rx for Suboxone     Allergies   Patient has no known allergies.   Review of Systems Review of Systems  Constitutional:  Negative for activity change, appetite change, fatigue and fever.  Respiratory:  Negative for cough and shortness of breath.   Cardiovascular:  Negative for chest pain.  Gastrointestinal:  Negative for abdominal pain, diarrhea, nausea and vomiting.  Genitourinary:  Positive for vaginal discharge. Negative for dysuria, frequency, urgency, vaginal bleeding and vaginal pain.  Neurological:  Negative for dizziness, light-headedness and headaches.    Physical Exam Triage Vital Signs ED Triage Vitals  Enc Vitals Group     BP 03/20/21 1349 97/62     Pulse Rate 03/20/21 1349 68     Resp 03/20/21 1349 18     Temp 03/20/21 1349 98.4 F (36.9 C)     Temp src --      SpO2 03/20/21 1349 98 %      Weight --      Height --      Head Circumference --      Peak Flow --      Pain Score 03/20/21 1340 0     Pain Loc --      Pain Edu? --      Excl. in GC? --  No data found.  Updated Vital Signs BP 97/62   Pulse 68   Temp 98.4 F (36.9 C)   Resp 18   LMP 02/17/2021 (Approximate)   SpO2 98%   Visual Acuity Right Eye Distance:   Left Eye Distance:   Bilateral Distance:    Right Eye Near:   Left Eye Near:    Bilateral Near:     Physical Exam Vitals reviewed.  Constitutional:      General: She is awake. She is not in acute distress.    Appearance: Normal appearance. She is normal weight. She is not ill-appearing.     Comments: Very pleasant female appears stated age in no acute distress  HENT:     Head: Normocephalic and atraumatic.  Cardiovascular:     Rate and Rhythm: Normal rate and regular rhythm.     Heart sounds: Normal heart sounds, S1 normal and S2 normal. No murmur heard. Pulmonary:     Effort: Pulmonary effort is normal.     Breath sounds: Normal breath sounds. No wheezing, rhonchi or rales.     Comments: Clear to auscultation bilaterally Abdominal:     General: Bowel sounds are normal.     Palpations: Abdomen is soft.     Tenderness: There is no abdominal tenderness. There is no right CVA tenderness, left CVA tenderness, guarding or rebound.     Comments: Benign abdominal exam  Genitourinary:    Comments: Exam deferred Psychiatric:        Behavior: Behavior is cooperative.     UC Treatments / Results  Labs (all labs ordered are listed, but only abnormal results are displayed) Labs Reviewed  CERVICOVAGINAL ANCILLARY ONLY    EKG   Radiology No results found.  Procedures Procedures (including critical care time)  Medications Ordered in UC Medications - No data to display  Initial Impression / Assessment and Plan / UC Course  I have reviewed the triage vital signs and the nursing notes.  Pertinent labs & imaging results that were  available during my care of the patient were reviewed by me and considered in my medical decision making (see chart for details).      STI swab collected today-results pending.  Patient empirically treated for bacterial vaginosis with metronidazole.  Discussed that she is not to drink any alcohol while taking this medication and for 72 hours after completing course due to Antabuse side effects.  Recommended she use hypoallergenic soaps and detergents and wear loosefitting cotton underwear.  We will contact her if additional treatment is required based on swab results.  Strict return precautions given to which patient expressed understanding.  Final Clinical Impressions(s) / UC Diagnoses   Final diagnoses:  Vaginal discharge  Vaginal odor  Routine screening for STI (sexually transmitted infection)     Discharge Instructions      We are treating you for bacterial vaginosis.  Please take Flagyl twice a day for 7 days.  It is important you do not drink any alcohol while taking this medication and for 3 days after completing this medicine as it would cause you to vomit.  Wear loosefitting cotton underwear and use hypoallergenic soaps and detergents.  We will contact you if we need to arrange any additional treatment based on your lab results.     ED Prescriptions     Medication Sig Dispense Auth. Provider   metroNIDAZOLE (FLAGYL) 500 MG tablet Take 1 tablet (500 mg total) by mouth 2 (two) times daily. 14 tablet Keari Miu, Denny Peon  K, PA-C      PDMP not reviewed this encounter.   Jeani HawkingRaspet, Fiorella Hanahan K, PA-C 03/20/21 1411

## 2021-03-20 NOTE — Discharge Instructions (Addendum)
We are treating you for bacterial vaginosis.  Please take Flagyl twice a day for 7 days.  It is important you do not drink any alcohol while taking this medication and for 3 days after completing this medicine as it would cause you to vomit.  Wear loosefitting cotton underwear and use hypoallergenic soaps and detergents.  We will contact you if we need to arrange any additional treatment based on your lab results.

## 2021-03-23 LAB — CERVICOVAGINAL ANCILLARY ONLY
Bacterial Vaginitis (gardnerella): POSITIVE — AB
Candida Glabrata: NEGATIVE
Candida Vaginitis: POSITIVE — AB
Chlamydia: NEGATIVE
Comment: NEGATIVE
Comment: NEGATIVE
Comment: NEGATIVE
Comment: NEGATIVE
Comment: NEGATIVE
Comment: NORMAL
Neisseria Gonorrhea: NEGATIVE
Trichomonas: POSITIVE — AB

## 2021-03-25 ENCOUNTER — Telehealth: Payer: Self-pay

## 2021-03-25 MED ORDER — FLUCONAZOLE 150 MG PO TABS: 150.0000 mg | ORAL_TABLET | Freq: Every day | ORAL | 0 refills | Status: AC

## 2021-05-05 ENCOUNTER — Other Ambulatory Visit: Payer: Self-pay

## 2021-05-05 ENCOUNTER — Ambulatory Visit (HOSPITAL_COMMUNITY)
Admission: EM | Admit: 2021-05-05 | Discharge: 2021-05-05 | Disposition: A | Discharge status: Home / Self Care | Payer: Medicaid Other | Attending: Internal Medicine | Admitting: Internal Medicine

## 2021-05-05 ENCOUNTER — Encounter (HOSPITAL_COMMUNITY): Payer: Self-pay

## 2021-05-05 DIAGNOSIS — N898 Other specified noninflammatory disorders of vagina: Secondary | ICD-10-CM | POA: Diagnosis present

## 2021-05-05 DIAGNOSIS — Z711 Person with feared health complaint in whom no diagnosis is made: Secondary | ICD-10-CM | POA: Diagnosis present

## 2021-05-05 LAB — HIV ANTIBODY (ROUTINE TESTING W REFLEX): HIV Screen 4th Generation wRfx: NONREACTIVE

## 2021-05-05 LAB — HEPATITIS C ANTIBODY: HCV Ab: REACTIVE — AB

## 2021-05-05 NOTE — ED Triage Notes (Signed)
Pt reports vaginal itchy and white vaginal discharge x 1 month.

## 2021-05-05 NOTE — ED Notes (Signed)
Placed labeled specimen in lab

## 2021-05-05 NOTE — ED Provider Notes (Signed)
[facility]    CSN: [id number] Arrival date & time: 05/05/21  1343      History   Chief Complaint Chief Complaint  Patient presents with   Vaginal Discharge   Vaginal Itching    HPI Cheryl [name] is a 35 y.o. female.   Vaginal Discharge Discharge started over one month ago She was last seen on 6/17 in urgent care for the same and was prescribed Flagyl and Diflucan for yeast, bacterial vaginosis, and trichomonas She reports that her partner was treated as well She states that she has been sexually active since this time, but did refrain from intercourse appropriately She has had a BTL Discharge appears thick and white She denies vaginal odors She denies vaginal pruritis, abnormal vaginal bleeding, dysuria, hematuria, pelvic pain, nausea, vomiting, fevers Endorses history of STIs She is sexually active.   Contraception: BTL.   No LMP recorded.  She does not douche. Desires HIV/RPR: yes Prior Hep C Ab: yes Otherwise feeling well     Past Medical History:  Diagnosis Date   Abnormal Pap smear    ADHD    Arthritis    bilateral jaws   Asthma    as a child   Bipolar 1 disorder (HCC)    Depression    GERD (gastroesophageal reflux disease)    with pregnancy   Headache    HSV infection    PTSD (post-traumatic stress disorder)    Schizophrenia (HCC)    TMJ (temporomandibular joint disorder)     Patient Active Problem List   Diagnosis Date Noted   S/P cesarean section 10/16/2015   S/P repeat low transverse C-section 04/14/2014    Past Surgical History:  Procedure Laterality Date   CESAREAN SECTION  04/30/2012   Procedure: CESAREAN SECTION;  Surgeon: Antionette Char, MD;  Location: [facility];  Service: Gynecology;  Laterality: N/A;  Primary cesarean section of baby girl at 2113   APGAR  9/9   CESAREAN SECTION N/A 04/14/2014   Procedure: CESAREAN SECTION;  Surgeon: Oliver Pila, MD;  Location: [facility];  Service: Obstetrics;  Laterality: N/A;    CESAREAN SECTION N/A 10/16/2015   Procedure: CESAREAN SECTION;  Surgeon: Sherian Rein, MD;  Location: [facility];  Service: Obstetrics;  Laterality: N/A;   COLPOSCOPY W/ BIOPSY / CURETTAGE     TUBAL LIGATION Bilateral 10/16/2015   Procedure: BILATERAL TUBAL LIGATION;  Surgeon: Sherian Rein, MD;  Location: [facility];  Service: Obstetrics;  Laterality: Bilateral;   WISDOM TOOTH EXTRACTION      OB History     Gravida  3   Para  3   Term  3   Preterm      AB      Living  3      SAB      IAB      Ectopic      Multiple  0   Live Births  3            Home Medications    Prior to Admission medications   Medication Sig Start Date End Date Taking? Authorizing Provider  albuterol (VENTOLIN HFA) 108 (90 Base) MCG/ACT inhaler Inhale 1-2 puffs into the lungs every 6 (six) hours as needed for wheezing or shortness of breath. 10/15/20   Mare Ferrari, PA-C  Buprenorphine HCl-Naloxone HCl (SUBOXONE SL) Place under the tongue.    [provider]  calcium carbonate (TUMS - DOSED IN MG ELEMENTAL CALCIUM) 500 MG chewable tablet Chew 2  tablets by mouth daily as needed for indigestion or heartburn.    [provider]  Citalopram Hydrobromide (CELEXA PO) Take by mouth.    [provider]  cloNIDine (CATAPRES) 0.1 MG tablet Take 0.1 mg by mouth daily.    [provider]  ibuprofen (ADVIL,MOTRIN) 600 MG tablet Take 1 tablet (600 mg total) by mouth every 6 (six) hours as needed. 04/26/16   Earley Favor, NP  ibuprofen (ADVIL,MOTRIN) 600 MG tablet Take 1 tablet (600 mg total) by mouth every 6 (six) hours as needed for mild pain. 04/26/16   Earley Favor, NP  Lurasidone HCl (LATUDA PO) Take by mouth.    [provider]  methocarbamol (ROBAXIN) 750 MG tablet Take 1-2 tablets (750-1,500 mg total) by mouth 3 (three) times daily as needed for muscle spasms. 02/28/18   Cristina Gong, PA-C  metroNIDAZOLE (FLAGYL) 500 MG tablet Take 1 tablet  (500 mg total) by mouth 2 (two) times daily. 03/20/21   Raspet, Noberto Retort, PA-C  ondansetron (ZOFRAN ODT) 4 MG disintegrating tablet Take 1 tablet (4 mg total) by mouth every 8 (eight) hours as needed for nausea or vomiting. 04/26/16   Earley Favor, NP  oxyCODONE-acetaminophen (PERCOCET/ROXICET) 5-325 MG tablet Take 1 tablet by mouth every 4 (four) hours as needed for severe pain. 10/19/15   Edwinna Areola, DO  Prenatal Vit-Fe Fumarate-FA (PRENATAL MULTIVITAMIN) TABS tablet Take 1 tablet by mouth daily at 12 noon.    [provider]    Family History Family History  Problem Relation Age of Onset   Anxiety disorder Mother    Depression Mother    Celiac disease Mother    Diabetes Sister    Lupus Sister    Other Neg Hx     Social History Social History   Tobacco Use   Smoking status: Every Day    Packs/day: 1.00    Years: 3.00    Pack years: 3.00    Types: Cigarettes    Last attempt to quit: 10/30/2010    Years since quitting: 10.5   Smokeless tobacco: Never  Vaping Use   Vaping Use: Never used  Substance Use Topics   Alcohol use: Not Currently    Comment: Socially    Drug use: Not Currently    Comment: Rx for Suboxone     Allergies   Patient has no known allergies.   Review of Systems Review of Systems  Constitutional:  Negative for activity change, appetite change, chills and fever.  HENT:  Negative for congestion.   Eyes:  Negative for visual disturbance.  Respiratory:  Negative for shortness of breath.   Cardiovascular:  Negative for chest pain.  Gastrointestinal:  Negative for abdominal pain.  Genitourinary:  Positive for vaginal discharge. Negative for difficulty urinating, dyspareunia and dysuria.  Musculoskeletal:  Negative for back pain.  Skin:  Negative for rash.  Neurological:  Negative for syncope.    Physical Exam Triage Vital Signs ED Triage Vitals  Enc Vitals Group     BP 05/05/21 1430 (!) 96/40     Pulse Rate 05/05/21 1430 67      Resp 05/05/21 1430 16     Temp 05/05/21 1430 98.7 F (37.1 C)     Temp Source 05/05/21 1430 Oral     SpO2 05/05/21 1430 98 %     Weight --      Height --      Head Circumference --      Peak Flow --  Pain Score 05/05/21 1427 0     Pain Loc --      Pain Edu? --      Excl. in GC? --    No data found.  Updated Vital Signs BP 95/60 (BP Location: Left Arm)   Pulse 67   Temp 98.7 F (37.1 C) (Oral)   Resp 16   SpO2 98%   Visual Acuity Right Eye Distance:   Left Eye Distance:   Bilateral Distance:    Right Eye Near:   Left Eye Near:    Bilateral Near:     Physical Exam Constitutional:      General: She is not in acute distress.    Appearance: Normal appearance. She is not ill-appearing or toxic-appearing.  HENT:     Head: Normocephalic and atraumatic.  Cardiovascular:     Rate and Rhythm: Normal rate.  Pulmonary:     Effort: Pulmonary effort is normal.     Breath sounds: Normal breath sounds.  Skin:    General: Skin is warm and dry.  Neurological:     General: No focal deficit present.     Mental Status: She is alert and oriented to person, place, and time.     Gait: Gait normal.  Psychiatric:        Mood and Affect: Mood normal.        Behavior: Behavior normal.        Thought Content: Thought content normal.        Judgment: Judgment normal.     UC Treatments / Results  Labs (all labs ordered are listed, but only abnormal results are displayed) Labs Reviewed  HIV ANTIBODY (ROUTINE TESTING W REFLEX)  RPR  HEPATITIS C ANTIBODY  CERVICOVAGINAL ANCILLARY ONLY    EKG   Radiology No results found.  Procedures Procedures (including critical care time)  Medications Ordered in UC Medications - No data to display  Initial Impression / Assessment and Plan / UC Course  I have reviewed the triage vital signs and the nursing notes.  Pertinent labs & imaging results that were available during my care of the patient were reviewed by me and considered  in my medical decision making (see chart for details).     Patient is a 35 year old female with a history of asthma, depression, bipolar 1 disorder, ADHD, schizophrenia who presents with complaint of vaginal discharge for over 1 month, she states that it has not improved since she was previously treated for bacterial vaginosis, trichomonas, yeast from her visit on 6/17 here at urgent care.  She was treated with metronidazole and Diflucan.  She reports that her partner was also appropriately treated.  We will repeat her vaginal testing for gonorrhea/chlamydia, trichomonas, bacterial vaginosis, yeast.  We will also obtain HIV/RPR and hepatitis C antibody at patient's request.  She is otherwise well-appearing and does not have any symptoms of systemic infection.  We will hold off on treatment until results return given lack of response to prior treatments.  She initially had a hypotensive blood pressure to 96/40, was asymptomatic during this.  Blood pressure was repeated and map improved to 68 with BP of 95/60.  She was discharged home in stable condition.    Final Clinical Impressions(s) / UC Diagnoses   Final diagnoses:  Vaginal discharge  Concern about STD in female without diagnosis     Discharge Instructions      We will test for different causes of vaginal discharge including sexually transmitted diseases.  We will  treat you based on these results.  If you have worsening of your symptoms, develop fevers, vomiting, or severe pelvic pain you should be seen here or the emergency room right away.  Refrain from intercourse until your tests have resulted.     ED Prescriptions   None    PDMP not reviewed this encounter.   Unknown JimMeccariello, Shariff Lasky J, DO 05/05/21 1503

## 2021-05-05 NOTE — Discharge Instructions (Addendum)
We will test for different causes of vaginal discharge including sexually transmitted diseases.  We will treat you based on these results.  If you have worsening of your symptoms, develop fevers, vomiting, or severe pelvic pain you should be seen here or the emergency room right away.  Refrain from intercourse until your tests have resulted.

## 2021-05-06 LAB — CERVICOVAGINAL ANCILLARY ONLY
Bacterial Vaginitis (gardnerella): NEGATIVE
Candida Glabrata: NEGATIVE
Candida Vaginitis: NEGATIVE
Chlamydia: NEGATIVE
Comment: NEGATIVE
Comment: NEGATIVE
Comment: NEGATIVE
Comment: NEGATIVE
Comment: NEGATIVE
Comment: NORMAL
Neisseria Gonorrhea: NEGATIVE
Trichomonas: NEGATIVE

## 2021-05-06 LAB — RPR: RPR Ser Ql: NONREACTIVE

## 2021-05-15 ENCOUNTER — Ambulatory Visit (HOSPITAL_COMMUNITY)
Admission: EM | Admit: 2021-05-15 | Discharge: 2021-05-15 | Disposition: A | Discharge status: Home / Self Care | Payer: Medicaid Other | Attending: Physician Assistant | Admitting: Physician Assistant

## 2021-05-15 ENCOUNTER — Other Ambulatory Visit: Payer: Self-pay

## 2021-05-15 ENCOUNTER — Encounter (HOSPITAL_COMMUNITY): Payer: Self-pay

## 2021-05-15 DIAGNOSIS — N898 Other specified noninflammatory disorders of vagina: Secondary | ICD-10-CM

## 2021-05-15 DIAGNOSIS — N760 Acute vaginitis: Secondary | ICD-10-CM | POA: Diagnosis present

## 2021-05-15 MED ORDER — FLUCONAZOLE 150 MG PO TABS: 150.0000 mg | ORAL_TABLET | ORAL | 0 refills | Status: DC | PRN

## 2021-05-15 NOTE — ED Provider Notes (Signed)
[facility]    CSN: [id number] Arrival date & time: 05/15/21  1504      History   Chief Complaint Chief Complaint  Patient presents with   Vaginal Discharge   Vaginal Itching    HPI [name] is a 35 y.o. female.   Patient presents today with a several week history of vaginal discharge.  She describes this as thick and white with associated vaginal pruritus.  She was seen in June and treated for bacterial vaginosis, trichomonas, yeast.  She reports symptoms improved but she never had complete resolution of yeast infection symptoms.  She was seen again on 05/05/2021 and given 1 more dose of Diflucan which provided again temporary relief of symptoms but did not resolve them.  She has not seen OB/GYN recently.  She denies any changes to personal hygiene products including soaps, detergents, tampons, condoms.  She denies any recent antibiotic use since June when she was prescribed metronidazole.  She denies history of diabetes or immunosuppression.   Past Medical History:  Diagnosis Date   Abnormal Pap smear    ADHD    Arthritis    bilateral jaws   Asthma    as a child   Bipolar 1 disorder (HCC)    Depression    GERD (gastroesophageal reflux disease)    with pregnancy   Headache    HSV infection    PTSD (post-traumatic stress disorder)    Schizophrenia (HCC)    TMJ (temporomandibular joint disorder)     Patient Active Problem List   Diagnosis Date Noted   S/P cesarean section 10/16/2015   S/P repeat low transverse C-section 04/14/2014    Past Surgical History:  Procedure Laterality Date   CESAREAN SECTION  04/30/2012   Procedure: CESAREAN SECTION;  Surgeon: Antionette CharLisa Jackson-Moore, MD;  Location: [facility];  Service: Gynecology;  Laterality: N/A;  Primary cesarean section of baby girl at 2113   APGAR  9/9   CESAREAN SECTION N/A 04/14/2014   Procedure: CESAREAN SECTION;  Surgeon: Oliver PilaKathy W Richardson, MD;  Location: [facility];  Service: Obstetrics;  Laterality: N/A;    CESAREAN SECTION N/A 10/16/2015   Procedure: CESAREAN SECTION;  Surgeon: Sherian ReinJody [name], MD;  Location: [facility];  Service: Obstetrics;  Laterality: N/A;   COLPOSCOPY W/ BIOPSY / CURETTAGE     TUBAL LIGATION Bilateral 10/16/2015   Procedure: BILATERAL TUBAL LIGATION;  Surgeon: Sherian ReinJody [name], MD;  Location: [facility];  Service: Obstetrics;  Laterality: Bilateral;   WISDOM TOOTH EXTRACTION      OB History     Gravida  3   Para  3   Term  3   Preterm      AB      Living  3      SAB      IAB      Ectopic      Multiple  0   Live Births  3            Home Medications    Prior to Admission medications   Medication Sig Start Date End Date Taking? Authorizing Provider  fluconazole (DIFLUCAN) 150 MG tablet Take 1 tablet (150 mg total) by mouth every 3 (three) days as needed. Can take second dose 3 days after first if symptoms persist. 05/15/21  Yes Erdem Naas K, PA-C  albuterol (VENTOLIN HFA) 108 (90 Base) MCG/ACT inhaler Inhale 1-2 puffs into the lungs every 6 (six) hours as needed for wheezing or shortness of breath. 10/15/20  Mare Ferrari, PA-C  Buprenorphine HCl-Naloxone HCl (SUBOXONE SL) Place under the tongue.    [provider]  calcium carbonate (TUMS - DOSED IN MG ELEMENTAL CALCIUM) 500 MG chewable tablet Chew 2 tablets by mouth daily as needed for indigestion or heartburn.    [provider]  Citalopram Hydrobromide (CELEXA PO) Take by mouth.    [provider]  cloNIDine (CATAPRES) 0.1 MG tablet Take 0.1 mg by mouth daily.    [provider]  ibuprofen (ADVIL,MOTRIN) 600 MG tablet Take 1 tablet (600 mg total) by mouth every 6 (six) hours as needed. 04/26/16   Earley Favor, NP  ibuprofen (ADVIL,MOTRIN) 600 MG tablet Take 1 tablet (600 mg total) by mouth every 6 (six) hours as needed for mild pain. 04/26/16   Earley Favor, NP  Lurasidone HCl (LATUDA PO) Take by mouth.    [provider]  methocarbamol (ROBAXIN)  750 MG tablet Take 1-2 tablets (750-1,500 mg total) by mouth 3 (three) times daily as needed for muscle spasms. 02/28/18   Cristina Gong, PA-C  ondansetron (ZOFRAN ODT) 4 MG disintegrating tablet Take 1 tablet (4 mg total) by mouth every 8 (eight) hours as needed for nausea or vomiting. 04/26/16   Earley Favor, NP  oxyCODONE-acetaminophen (PERCOCET/ROXICET) 5-325 MG tablet Take 1 tablet by mouth every 4 (four) hours as needed for severe pain. 10/19/15   Edwinna Areola, DO  Prenatal Vit-Fe Fumarate-FA (PRENATAL MULTIVITAMIN) TABS tablet Take 1 tablet by mouth daily at 12 noon.    [provider]    Family History Family History  Problem Relation Age of Onset   Anxiety disorder Mother    Depression Mother    Celiac disease Mother    Diabetes Sister    Lupus Sister    Other Neg Hx     Social History Social History   Tobacco Use   Smoking status: Every Day    Packs/day: 1.00    Years: 3.00    Pack years: 3.00    Types: Cigarettes    Last attempt to quit: 10/30/2010    Years since quitting: 10.5   Smokeless tobacco: Never  Vaping Use   Vaping Use: Never used  Substance Use Topics   Alcohol use: Not Currently    Comment: Socially    Drug use: Not Currently    Comment: Rx for Suboxone     Allergies   Patient has no known allergies.   Review of Systems Review of Systems  Constitutional:  Negative for activity change, appetite change, fatigue and fever.  Respiratory:  Negative for cough and shortness of breath.   Cardiovascular:  Negative for chest pain.  Gastrointestinal:  Negative for abdominal pain, diarrhea, nausea and vomiting.  Genitourinary:  Positive for vaginal discharge. Negative for dysuria, frequency, urgency, vaginal bleeding and vaginal pain.    Physical Exam Triage Vital Signs ED Triage Vitals [05/15/21 1630]  Enc Vitals Group     BP (!) 89/64     Pulse Rate 68     Resp      Temp 98.5 F (36.9 C)     Temp Source Oral     SpO2 100  %     Weight      Height      Head Circumference      Peak Flow      Pain Score 0     Pain Loc      Pain Edu?      Excl. in  GC?    No data found.  Updated Vital Signs BP (!) 98/59 (BP Location: Right Arm)   Pulse 70   Temp 98.5 F (36.9 C) (Oral)   Resp 16   LMP 05/15/2021   SpO2 100%   Visual Acuity Right Eye Distance:   Left Eye Distance:   Bilateral Distance:    Right Eye Near:   Left Eye Near:    Bilateral Near:     Physical Exam Vitals reviewed.  Constitutional:      General: She is awake. She is not in acute distress.    Appearance: Normal appearance. She is normal weight. She is not ill-appearing.     Comments: Very pleasant female appears stated age in no acute distress sitting comfortably in exam room  HENT:     Head: Normocephalic and atraumatic.  Cardiovascular:     Rate and Rhythm: Normal rate and regular rhythm.     Heart sounds: Normal heart sounds, S1 normal and S2 normal. No murmur heard. Pulmonary:     Effort: Pulmonary effort is normal.     Breath sounds: Normal breath sounds. No wheezing, rhonchi or rales.     Comments: Clear to auscultation bilaterally Abdominal:     General: Bowel sounds are normal.     Palpations: Abdomen is soft.     Tenderness: There is no abdominal tenderness. There is no right CVA tenderness, left CVA tenderness, guarding or rebound.     Comments: Benign abdominal exam  Genitourinary:    Comments: Exam deferred Psychiatric:        Behavior: Behavior is cooperative.     UC Treatments / Results  Labs (all labs ordered are listed, but only abnormal results are displayed) Labs Reviewed  CERVICOVAGINAL ANCILLARY ONLY    EKG   Radiology No results found.  Procedures Procedures (including critical care time)  Medications Ordered in UC Medications - No data to display  Initial Impression / Assessment and Plan / UC Course  I have reviewed the triage vital signs and the nursing notes.  Pertinent labs &  imaging results that were available during my care of the patient were reviewed by me and considered in my medical decision making (see chart for details).     We will empirically treat for yeast infection given clinical presentation.  Patient was prescribed 2 doses of Diflucan with instruction to take them 72 hours apart if symptoms or not improving.  Discussed that if she continues to have symptoms despite this treatment it may be worthwhile to see an OB/GYN to consider culture of yeast to make sure we are treating this appropriately and there is no resistance to typical treatments.  Recommended she use hypoallergenic soaps and detergents and wear loosefitting cotton underwear.  STI swab collected today-results pending.  Will make additional recommendations regarding treatment based on laboratory results.  Discussed alarm symptoms that warrant emergent evaluation.  Strict return precautions given to which patient expressed understanding.   Final Clinical Impressions(s) / UC Diagnoses   Final diagnoses:  Vaginal discharge  Acute vaginitis  Vaginal itching     Discharge Instructions      We will contact you with your results if we need to arrange any additional treatment.  Please take Diflucan today and take second dose 3 days later if your symptoms persist.  Make sure you wear loosefitting cotton underwear and loose hypoallergenic soaps and detergents.  I think it is important that you follow-up with OB/GYN if your symptoms are not  improving as you may need more testing than we are able to provide in urgent care.  If you have any worsening symptoms please return for reevaluation.     ED Prescriptions     Medication Sig Dispense Auth. Provider   fluconazole (DIFLUCAN) 150 MG tablet Take 1 tablet (150 mg total) by mouth every 3 (three) days as needed. Can take second dose 3 days after first if symptoms persist. 2 tablet Scharlene Catalina K, PA-C      PDMP not reviewed this encounter.    Jeani Hawking, PA-C 05/15/21 1713

## 2021-05-15 NOTE — Discharge Instructions (Addendum)
We will contact you with your results if we need to arrange any additional treatment.  Please take Diflucan today and take second dose 3 days later if your symptoms persist.  Make sure you wear loosefitting cotton underwear and loose hypoallergenic soaps and detergents.  I think it is important that you follow-up with OB/GYN if your symptoms are not improving as you may need more testing than we are able to provide in urgent care.  If you have any worsening symptoms please return for reevaluation.

## 2021-05-15 NOTE — ED Triage Notes (Signed)
Pt presents with ongoing thick Cheryl [name] discharge and vaginal itching for over a week.

## 2021-05-18 LAB — CERVICOVAGINAL ANCILLARY ONLY
Bacterial Vaginitis (gardnerella): NEGATIVE
Candida Glabrata: NEGATIVE
Candida Vaginitis: NEGATIVE
Chlamydia: NEGATIVE
Comment: NEGATIVE
Comment: NEGATIVE
Comment: NEGATIVE
Comment: NEGATIVE
Comment: NEGATIVE
Comment: NORMAL
Neisseria Gonorrhea: NEGATIVE
Trichomonas: NEGATIVE

## 2021-12-09 ENCOUNTER — Other Ambulatory Visit: Payer: Self-pay

## 2021-12-09 ENCOUNTER — Ambulatory Visit (HOSPITAL_COMMUNITY)
Admission: EM | Admit: 2021-12-09 | Discharge: 2021-12-09 | Disposition: A | Discharge status: Home / Self Care | Payer: Medicaid Other | Attending: Internal Medicine | Admitting: Internal Medicine

## 2021-12-09 ENCOUNTER — Encounter (HOSPITAL_COMMUNITY): Payer: Self-pay | Admitting: Emergency Medicine

## 2021-12-09 DIAGNOSIS — Z113 Encounter for screening for infections with a predominantly sexual mode of transmission: Secondary | ICD-10-CM | POA: Diagnosis not present

## 2021-12-09 DIAGNOSIS — N898 Other specified noninflammatory disorders of vagina: Secondary | ICD-10-CM | POA: Diagnosis not present

## 2021-12-09 DIAGNOSIS — Z114 Encounter for screening for human immunodeficiency virus [HIV]: Secondary | ICD-10-CM | POA: Diagnosis not present

## 2021-12-09 LAB — RPR: RPR Ser Ql: NONREACTIVE

## 2021-12-09 LAB — HIV ANTIBODY (ROUTINE TESTING W REFLEX): HIV Screen 4th Generation wRfx: NONREACTIVE

## 2021-12-09 NOTE — ED Provider Notes (Signed)
[facility]    CSN: [id number] Arrival date & time: 12/09/21  0805      History   Chief Complaint Chief Complaint  Patient presents with   Vaginal Itching    HPI Cheryl [name] is a 36 y.o. female.   Vaginal Discharge Discharge started 2-3 days ago after first intercourse with new boyfriend Discharge appears thick, white She endorses vaginal odors, slight pruritis She denies abnormal vaginal bleeding, dysuria, hematuria, pelvic pain, nausea, vomiting, fevers Reports history of STIs She is sexually active and does not use condoms.   Contraception: BTL.   Patient's last menstrual period was 12/02/2021 (approximate).  She does not douche. Desires HIV/RPR: yes     Past Medical History:  Diagnosis Date   Abnormal Pap smear    ADHD    Arthritis    bilateral jaws   Asthma    as a child   Bipolar 1 disorder (HCC)    Depression    GERD (gastroesophageal reflux disease)    with pregnancy   Headache    HSV infection    PTSD (post-traumatic stress disorder)    Schizophrenia (HCC)    TMJ (temporomandibular joint disorder)     Patient Active Problem List   Diagnosis Date Noted   S/P cesarean section 10/16/2015   S/P repeat low transverse C-section 04/14/2014    Past Surgical History:  Procedure Laterality Date   CESAREAN SECTION  04/30/2012   Procedure: CESAREAN SECTION;  Surgeon: Antionette Char, MD;  Location: [facility];  Service: Gynecology;  Laterality: N/A;  Primary cesarean section of baby girl at 2113   APGAR  9/9   CESAREAN SECTION N/A 04/14/2014   Procedure: CESAREAN SECTION;  Surgeon: Oliver Pila, MD;  Location: [facility];  Service: Obstetrics;  Laterality: N/A;   CESAREAN SECTION N/A 10/16/2015   Procedure: CESAREAN SECTION;  Surgeon: Sherian Rein, MD;  Location: [facility];  Service: Obstetrics;  Laterality: N/A;   COLPOSCOPY W/ BIOPSY / CURETTAGE     TUBAL LIGATION Bilateral 10/16/2015   Procedure: BILATERAL TUBAL LIGATION;  Surgeon:  Sherian Rein, MD;  Location: [facility];  Service: Obstetrics;  Laterality: Bilateral;   WISDOM TOOTH EXTRACTION      OB History     Gravida  3   Para  3   Term  3   Preterm      AB      Living  3      SAB      IAB      Ectopic      Multiple  0   Live Births  3            Home Medications    Prior to Admission medications   Medication Sig Start Date End Date Taking? Authorizing Provider  NON FORMULARY Antibiotic that she is taking once a week   Yes [provider]  albuterol (VENTOLIN HFA) 108 (90 Base) MCG/ACT inhaler Inhale 1-2 puffs into the lungs every 6 (six) hours as needed for wheezing or shortness of breath. Patient not taking: Reported on 12/09/2021 10/15/20   Mare Ferrari, PA-C  Buprenorphine HCl-Naloxone HCl (SUBOXONE SL) Place under the tongue.    [provider]  calcium carbonate (TUMS - DOSED IN MG ELEMENTAL CALCIUM) 500 MG chewable tablet Chew 2 tablets by mouth daily as needed for indigestion or heartburn.    [provider]  Citalopram Hydrobromide (CELEXA PO) Take by mouth.    [provider]  cloNIDine (CATAPRES) 0.1 MG tablet Take 0.1 mg by mouth daily.    [provider]  fluconazole (DIFLUCAN) 150 MG tablet Take 1 tablet (150 mg total) by mouth every 3 (three) days as needed. Can take second dose 3 days after first if symptoms persist. Patient not taking: Reported on 12/09/2021 05/15/21   Raspet, Noberto Retort, PA-C  ibuprofen (ADVIL,MOTRIN) 600 MG tablet Take 1 tablet (600 mg total) by mouth every 6 (six) hours as needed. 04/26/16   Earley Favor, NP  ibuprofen (ADVIL,MOTRIN) 600 MG tablet Take 1 tablet (600 mg total) by mouth every 6 (six) hours as needed for mild pain. 04/26/16   Earley Favor, NP  Lurasidone HCl (LATUDA PO) Take by mouth.    [provider]  methocarbamol (ROBAXIN) 750 MG tablet Take 1-2 tablets (750-1,500 mg total) by mouth 3 (three) times daily as needed for muscle spasms.  02/28/18   Cristina Gong, PA-C  ondansetron (ZOFRAN ODT) 4 MG disintegrating tablet Take 1 tablet (4 mg total) by mouth every 8 (eight) hours as needed for nausea or vomiting. 04/26/16   Earley Favor, NP  oxyCODONE-acetaminophen (PERCOCET/ROXICET) 5-325 MG tablet Take 1 tablet by mouth every 4 (four) hours as needed for severe pain. 10/19/15   Edwinna Areola, DO  Prenatal Vit-Fe Fumarate-FA (PRENATAL MULTIVITAMIN) TABS tablet Take 1 tablet by mouth daily at 12 noon.    [provider]    Family History Family History  Problem Relation Age of Onset   Anxiety disorder Mother    Depression Mother    Celiac disease Mother    Diabetes Sister    Lupus Sister    Other Neg Hx     Social History Social History   Tobacco Use   Smoking status: Every Day    Packs/day: 1.00    Years: 3.00    Pack years: 3.00    Types: Cigarettes    Last attempt to quit: 10/30/2010    Years since quitting: 11.1   Smokeless tobacco: Never  Vaping Use   Vaping Use: Every day  Substance Use Topics   Alcohol use: Not Currently    Comment: Socially    Drug use: Not Currently    Comment: Rx for Suboxone     Allergies   Patient has no known allergies.   Review of Systems Review of Systems  All other systems reviewed and are negative.  Per HPI Physical Exam Triage Vital Signs ED Triage Vitals  Enc Vitals Group     BP      Pulse      Resp      Temp      Temp src      SpO2      Weight      Height      Head Circumference      Peak Flow      Pain Score      Pain Loc      Pain Edu?      Excl. in [place]?    No data found.  Updated Vital Signs BP 103/74 (BP Location: Right Arm)    Pulse 75    Temp 98.8 F (37.1 C) (Oral)    Resp 18    LMP 12/02/2021 (Approximate)    SpO2 98%   Visual Acuity Right Eye Distance:   Left Eye Distance:   Bilateral Distance:    Right Eye Near:   Left Eye Near:    Bilateral Near:  Physical Exam Constitutional:      General: She is not  in acute distress.    Appearance: Normal appearance. She is not ill-appearing.  HENT:     Head: Normocephalic and atraumatic.  Eyes:     Conjunctiva/sclera: Conjunctivae normal.  Cardiovascular:     Rate and Rhythm: Normal rate.  Pulmonary:     Effort: Pulmonary effort is normal. No respiratory distress.  Musculoskeletal:        General: No swelling.     Cervical back: Normal range of motion.  Skin:    General: Skin is warm and dry.  Neurological:     Mental Status: She is alert and oriented to person, place, and time.  Psychiatric:        Mood and Affect: Mood normal.        Behavior: Behavior normal.     UC Treatments / Results  Labs (all labs ordered are listed, but only abnormal results are displayed) Labs Reviewed  HIV ANTIBODY (ROUTINE TESTING W REFLEX)  RPR  CERVICOVAGINAL ANCILLARY ONLY    EKG   Radiology No results found.  Procedures Procedures (including critical care time)  Medications Ordered in UC Medications - No data to display  Initial Impression / Assessment and Plan / UC Course  I have reviewed the triage vital signs and the nursing notes.  Pertinent labs & imaging results that were available during my care of the patient were reviewed by me and considered in my medical decision making (see chart for details).     Likely bacterial vaginosis.  Swab obtained and STI testing performed.  HIV/RPR performed at patient request.  Discussed use of vaginal probiotic.  NO concern for pregnancy given BTL.  Return precautions discussed, see AVS.   Final Clinical Impressions(s) / UC Diagnoses   Final diagnoses:  Vaginal discharge  Screen for sexually transmitted diseases  Screening for HIV (human immunodeficiency virus)     Discharge Instructions      AndAs we discussed, we have tested you for sexually transmitted diseases as well as bacterial vaginosis and a yeast infection.  The results should come back tomorrow and we will treat you based off  of these results.  We have also done a blood test for HIV and syphilis and we will contact you with these results as well.  If you have significant worsening of your symptoms, especially if you develop pelvic pain, fever, vomiting, you should be seen by medical provider right away.   To prevent recurrent bacterial vaginosis, I recommend starting a women's probiotic.  You can find these at [employer], [employer], the pharmacy, and [employer].  They are usually in pink bottles.  There are some examples below.  Taking these daily will not completely prevent bacterial vaginosis, but can greatly reduce the recurrence.        ED Prescriptions   None    PDMP not reviewed this encounter.   Chanceler Pullin, Solmon IceBailey J, DO 12/09/21 [phone]

## 2021-12-09 NOTE — Discharge Instructions (Signed)
AndAs we discussed, we have tested you for sexually transmitted diseases as well as bacterial vaginosis and a yeast infection.  The results should come back tomorrow and we will treat you based off of these results.  We have also done a blood test for HIV and syphilis and we will contact you with these results as well.  If you have significant worsening of your symptoms, especially if you develop pelvic pain, fever, vomiting, you should be seen by medical provider right away. ? ? ?To prevent recurrent bacterial vaginosis, I recommend starting a women's probiotic.  You can find these at [employer], [employer], the pharmacy, and [employer].  They are usually in pink bottles.  There are some examples below.  Taking these daily will not completely prevent bacterial vaginosis, but can greatly reduce the recurrence. ? ? ? ?

## 2021-12-09 NOTE — ED Triage Notes (Signed)
For 2-3 days has had a fishy smell, vaginal discharge.  Patient has a new partner.   ?

## 2021-12-10 ENCOUNTER — Telehealth (HOSPITAL_COMMUNITY): Payer: Self-pay | Admitting: Emergency Medicine

## 2021-12-10 LAB — CERVICOVAGINAL ANCILLARY ONLY
Bacterial Vaginitis (gardnerella): POSITIVE — AB
Candida Glabrata: NEGATIVE
Candida Vaginitis: NEGATIVE
Chlamydia: NEGATIVE
Comment: NEGATIVE
Comment: NEGATIVE
Comment: NEGATIVE
Comment: NEGATIVE
Comment: NEGATIVE
Comment: NORMAL
Neisseria Gonorrhea: NEGATIVE
Trichomonas: NEGATIVE

## 2021-12-10 MED ORDER — METRONIDAZOLE 500 MG PO TABS: 500.0000 mg | ORAL_TABLET | Freq: Two times a day (BID) | ORAL | 0 refills | Status: DC

## 2022-08-02 ENCOUNTER — Ambulatory Visit (HOSPITAL_COMMUNITY)
Admission: EM | Admit: 2022-08-02 | Discharge: 2022-08-02 | Disposition: A | Discharge status: Home / Self Care | Payer: Medicaid Other | Attending: Internal Medicine | Admitting: Internal Medicine

## 2022-08-02 ENCOUNTER — Encounter (HOSPITAL_COMMUNITY): Payer: Self-pay | Admitting: Emergency Medicine

## 2022-08-02 ENCOUNTER — Other Ambulatory Visit: Payer: Self-pay

## 2022-08-02 DIAGNOSIS — Z1152 Encounter for screening for COVID-19: Secondary | ICD-10-CM | POA: Insufficient documentation

## 2022-08-02 DIAGNOSIS — F17210 Nicotine dependence, cigarettes, uncomplicated: Secondary | ICD-10-CM | POA: Insufficient documentation

## 2022-08-02 DIAGNOSIS — J069 Acute upper respiratory infection, unspecified: Secondary | ICD-10-CM | POA: Diagnosis not present

## 2022-08-02 MED ORDER — BENZONATATE 100 MG PO CAPS: 100.0000 mg | ORAL_CAPSULE | Freq: Three times a day (TID) | ORAL | 0 refills | Status: DC | PRN

## 2022-08-02 NOTE — Discharge Instructions (Addendum)
You have a viral upper respiratory infection that should self resolve with symptomatic treatment as we discussed.  I have prescribed you a cough medication to take as needed.  COVID test pending.  We will call if it is positive.

## 2022-08-02 NOTE — ED Triage Notes (Signed)
Cough and congestion started 3 days ago.  Also complains of throat tickling, allergy symptoms, no fever.    Has taken theraflu, dayquil.    Has not done a covid test

## 2022-08-02 NOTE — ED Provider Notes (Signed)
[facility]    CSN: [id number] Arrival date & time: 08/02/22  1745      History   Chief Complaint Chief Complaint  Patient presents with   Cough    HPI Cheryl [name] is a 36 y.o. female.   Patient presents with cough and nasal congestion that started about 3 days ago.  Patient also reports a scratchy throat.  Denies any known fevers but does report that her boyfriend has had similar symptoms.  She denies sore throat, chest pain, shortness of breath, ear pain, nausea, vomiting, diarrhea, or pain.  Has taken TheraFlu and Mucinex with improvement of symptoms.  Patient reports history of asthma but has not had to use albuterol inhaler. Requesting a work note.    Cough   Past Medical History:  Diagnosis Date   Abnormal Pap smear    ADHD    Arthritis    bilateral jaws   Asthma    as a child   Bipolar 1 disorder (HCC)    Depression    GERD (gastroesophageal reflux disease)    with pregnancy   Headache    HSV infection    PTSD (post-traumatic stress disorder)    Schizophrenia (HCC)    TMJ (temporomandibular joint disorder)     Patient Active Problem List   Diagnosis Date Noted   S/P cesarean section 10/16/2015   S/P repeat low transverse C-section 04/14/2014    Past Surgical History:  Procedure Laterality Date   CESAREAN SECTION  04/30/2012   Procedure: CESAREAN SECTION;  Surgeon: Antionette Char, MD;  Location: [facility];  Service: Gynecology;  Laterality: N/A;  Primary cesarean section of baby girl at 2113   APGAR  9/9   CESAREAN SECTION N/A 04/14/2014   Procedure: CESAREAN SECTION;  Surgeon: Oliver Pila, MD;  Location: [facility];  Service: Obstetrics;  Laterality: N/A;   CESAREAN SECTION N/A 10/16/2015   Procedure: CESAREAN SECTION;  Surgeon: Sherian Rein, MD;  Location: [facility];  Service: Obstetrics;  Laterality: N/A;   COLPOSCOPY W/ BIOPSY / CURETTAGE     TUBAL LIGATION Bilateral 10/16/2015   Procedure: BILATERAL TUBAL LIGATION;  Surgeon:  Sherian Rein, MD;  Location: [facility];  Service: Obstetrics;  Laterality: Bilateral;   WISDOM TOOTH EXTRACTION      OB History     Gravida  3   Para  3   Term  3   Preterm      AB      Living  3      SAB      IAB      Ectopic      Multiple  0   Live Births  3            Home Medications    Prior to Admission medications   Medication Sig Start Date End Date Taking? Authorizing Provider  benzonatate (TESSALON) 100 MG capsule Take 1 capsule (100 mg total) by mouth every 8 (eight) hours as needed for cough. 08/02/22  Yes , Acie Fredrickson, FNP  albuterol (VENTOLIN HFA) 108 (90 Base) MCG/ACT inhaler Inhale 1-2 puffs into the lungs every 6 (six) hours as needed for wheezing or shortness of breath. Patient not taking: Reported on 12/09/2021 10/15/20   Mare Ferrari, PA-C  Buprenorphine HCl-Naloxone HCl (SUBOXONE SL) Place under the tongue.    [provider]  calcium carbonate (TUMS - DOSED IN MG ELEMENTAL CALCIUM) 500 MG chewable tablet Chew 2 tablets by mouth daily as needed for  indigestion or heartburn.    [provider]  Citalopram Hydrobromide (CELEXA PO) Take by mouth.    [provider]  cloNIDine (CATAPRES) 0.1 MG tablet Take 0.1 mg by mouth daily. Patient not taking: Reported on 08/02/2022    [provider]  fluconazole (DIFLUCAN) 150 MG tablet Take 1 tablet (150 mg total) by mouth every 3 (three) days as needed. Can take second dose 3 days after first if symptoms persist. Patient not taking: Reported on 12/09/2021 05/15/21   Raspet, Noberto Retort, PA-C  ibuprofen (ADVIL,MOTRIN) 600 MG tablet Take 1 tablet (600 mg total) by mouth every 6 (six) hours as needed. 04/26/16   Earley Favor, NP  ibuprofen (ADVIL,MOTRIN) 600 MG tablet Take 1 tablet (600 mg total) by mouth every 6 (six) hours as needed for mild pain. 04/26/16   Earley Favor, NP  Lurasidone HCl (LATUDA PO) Take by mouth.    [provider]  methocarbamol (ROBAXIN) 750  MG tablet Take 1-2 tablets (750-1,500 mg total) by mouth 3 (three) times daily as needed for muscle spasms. 02/28/18   Cristina Gong, PA-C  metroNIDAZOLE (FLAGYL) 500 MG tablet Take 1 tablet (500 mg total) by mouth 2 (two) times daily. Patient not taking: Reported on 08/02/2022 12/10/21   Lamptey, Britta Mccreedy, MD  NON FORMULARY Antibiotic that she is taking once a week    [provider]  ondansetron (ZOFRAN ODT) 4 MG disintegrating tablet Take 1 tablet (4 mg total) by mouth every 8 (eight) hours as needed for nausea or vomiting. 04/26/16   Earley Favor, NP  oxyCODONE-acetaminophen (PERCOCET/ROXICET) 5-325 MG tablet Take 1 tablet by mouth every 4 (four) hours as needed for severe pain. 10/19/15   Edwinna Areola, DO  Prenatal Vit-Fe Fumarate-FA (PRENATAL MULTIVITAMIN) TABS tablet Take 1 tablet by mouth daily at 12 noon.    [provider]    Family History Family History  Problem Relation Age of Onset   Anxiety disorder Mother    Depression Mother    Celiac disease Mother    Diabetes Sister    Lupus Sister    Other Neg Hx     Social History Social History   Tobacco Use   Smoking status: Every Day    Packs/day: 1.00    Years: 3.00    Total pack years: 3.00    Types: Cigarettes    Last attempt to quit: 10/30/2010    Years since quitting: 11.7   Smokeless tobacco: Never  Vaping Use   Vaping Use: Some days  Substance Use Topics   Alcohol use: Not Currently    Comment: Socially    Drug use: Not Currently    Comment: Rx for Suboxone     Allergies   Patient has no known allergies.   Review of Systems Review of Systems Per HPI  Physical Exam Triage Vital Signs ED Triage Vitals  Enc Vitals Group     BP 08/02/22 1819 100/65     Pulse Rate 08/02/22 1819 84     Resp 08/02/22 1819 18     Temp 08/02/22 1819 98.9 F (37.2 C)     Temp Source 08/02/22 1819 Oral     SpO2 08/02/22 1819 95 %     Weight --      Height --      Head Circumference --       Peak Flow --      Pain Score 08/02/22 1816 0     Pain Loc --  Pain Edu? --      Excl. in [place]? --    No data found.  Updated Vital Signs BP 100/65 (BP Location: Right Arm)   Pulse 84   Temp 98.9 F (37.2 C) (Oral)   Resp 18   LMP 08/02/2022   SpO2 95%   Visual Acuity Right Eye Distance:   Left Eye Distance:   Bilateral Distance:    Right Eye Near:   Left Eye Near:    Bilateral Near:     Physical Exam Constitutional:      General: She is not in acute distress.    Appearance: Normal appearance. She is not toxic-appearing or diaphoretic.  HENT:     Head: Normocephalic and atraumatic.     Right Ear: Tympanic membrane and ear canal normal.     Left Ear: Tympanic membrane and ear canal normal.     Nose: Congestion present.     Mouth/Throat:     Mouth: Mucous membranes are moist.     Pharynx: No posterior oropharyngeal erythema.  Eyes:     Extraocular Movements: Extraocular movements intact.     Conjunctiva/sclera: Conjunctivae normal.     Pupils: Pupils are equal, round, and reactive to light.  Cardiovascular:     Rate and Rhythm: Normal rate and regular rhythm.     Pulses: Normal pulses.     Heart sounds: Normal heart sounds.  Pulmonary:     Effort: Pulmonary effort is normal. No respiratory distress.     Breath sounds: Normal breath sounds. No stridor. No wheezing, rhonchi or rales.  Abdominal:     General: Abdomen is flat. Bowel sounds are normal.     Palpations: Abdomen is soft.  Musculoskeletal:        General: Normal range of motion.     Cervical back: Normal range of motion.  Skin:    General: Skin is warm and dry.  Neurological:     General: No focal deficit present.     Mental Status: She is alert and oriented to person, place, and time. Mental status is at baseline.  Psychiatric:        Mood and Affect: Mood normal.        Behavior: Behavior normal.      UC Treatments / Results  Labs (all labs ordered are listed, but only abnormal results are  displayed) Labs Reviewed  SARS CORONAVIRUS 2 (TAT 6-24 HRS)    EKG   Radiology No results found.  Procedures Procedures (including critical care time)  Medications Ordered in UC Medications - No data to display  Initial Impression / Assessment and Plan / UC Course  I have reviewed the triage vital signs and the nursing notes.  Pertinent labs & imaging results that were available during my care of the patient were reviewed by me and considered in my medical decision making (see chart for details).     Patient presents with symptoms likely from a viral upper respiratory infection. Differential includes bacterial pneumonia, sinusitis, allergic rhinitis, covid 19, flu, RSV. Do not suspect underlying cardiopulmonary process. Symptoms seem unlikely related to ACS, CHF or COPD exacerbations, pneumonia, pneumothorax. Patient is nontoxic appearing and not in need of emergent medical intervention. Covid test pending.   Recommended symptom control with over the counter medications. Patient sent cough medication.   Return if symptoms fail to improve in 1-2 weeks or you develop shortness of breath, chest pain, severe headache. Patient states understanding and is agreeable.  Discharged with PCP followup.  Final Clinical Impressions(s) / UC Diagnoses   Final diagnoses:  Viral URI with cough     Discharge Instructions      You have a viral upper respiratory infection that should self resolve with symptomatic treatment as we discussed.  I have prescribed you a cough medication to take as needed.  COVID test pending.  We will call if it is positive.    ED Prescriptions     Medication Sig Dispense Auth. Provider   benzonatate (TESSALON) 100 MG capsule Take 1 capsule (100 mg total) by mouth every 8 (eight) hours as needed for cough. 21 capsule [place], Acie Fredrickson, [place]      PDMP not reviewed this encounter.   Gustavus Bryant, [place] 08/02/22 [phone]

## 2022-08-03 LAB — SARS CORONAVIRUS 2 (TAT 6-24 HRS): SARS Coronavirus 2: NEGATIVE

## 2022-11-06 ENCOUNTER — Encounter (HOSPITAL_COMMUNITY): Payer: Self-pay

## 2022-11-06 ENCOUNTER — Ambulatory Visit (HOSPITAL_COMMUNITY)
Admission: EM | Admit: 2022-11-06 | Discharge: 2022-11-06 | Disposition: A | Discharge status: Home / Self Care | Payer: Medicaid Other | Attending: Internal Medicine | Admitting: Internal Medicine

## 2022-11-06 DIAGNOSIS — Z1152 Encounter for screening for COVID-19: Secondary | ICD-10-CM | POA: Insufficient documentation

## 2022-11-06 DIAGNOSIS — J209 Acute bronchitis, unspecified: Secondary | ICD-10-CM | POA: Diagnosis present

## 2022-11-06 MED ORDER — ALBUTEROL SULFATE HFA 108 (90 BASE) MCG/ACT IN AERS
2.0000 | INHALATION_SPRAY | Freq: Once | RESPIRATORY_TRACT | Status: AC
  Administered 2022-11-06: 2 via RESPIRATORY_TRACT

## 2022-11-06 MED ORDER — PROMETHAZINE-DM 6.25-15 MG/5ML PO SYRP: 5.0000 mL | ORAL_SOLUTION | Freq: Every evening | ORAL | 0 refills | Status: DC | PRN

## 2022-11-06 MED ORDER — PREDNISONE 20 MG PO TABS: 40.0000 mg | ORAL_TABLET | Freq: Every day | ORAL | 0 refills | Status: AC

## 2022-11-06 MED ORDER — BENZONATATE 100 MG PO CAPS: 100.0000 mg | ORAL_CAPSULE | Freq: Three times a day (TID) | ORAL | 0 refills | Status: DC | PRN

## 2022-11-06 MED ORDER — ALBUTEROL SULFATE HFA 108 (90 BASE) MCG/ACT IN AERS: 1.0000 | INHALATION_SPRAY | Freq: Four times a day (QID) | RESPIRATORY_TRACT | 0 refills | Status: AC | PRN

## 2022-11-06 MED ORDER — ALBUTEROL SULFATE HFA 108 (90 BASE) MCG/ACT IN AERS
INHALATION_SPRAY | RESPIRATORY_TRACT | Status: AC
  Filled 2022-11-06: qty 6.7

## 2022-11-06 NOTE — Discharge Instructions (Addendum)
You have bronchitis which is inflammation of the upper airways in your lungs due to a virus. The following medicines will help with your symptoms.   - Take steroid sent to pharmacy as directed. Do not take any other NSAID containing medications such as ibuprofen or naproxen/Aleve while taking prednisone. - You may use albuterol inhaler 1 to 2 puffs every 4-6 hours as needed for cough, shortness of breath, and wheezing. - Tessalon perles every 8 hours as needed for cough. - Promethazine DM cough syrup at bedtime as needed.  Do not use this during the day, if you go to work, or drink alcohol when using this as it can make you very sleepy.   If you develop any new or worsening symptoms or do not improve in the next 2 to 3 days, please return.  If your symptoms are severe, please go to the emergency room.  Follow-up with your primary care provider for further evaluation and management of your symptoms as well as ongoing wellness visits.  I hope you feel better!

## 2022-11-06 NOTE — ED Triage Notes (Signed)
Started "I think Monday". Seems to be worse now "I see blood in sputum at times". No sob. No wheezing. Last known Fever "103.5, the other night, Tuesday". Some runny nose "now too". Some nausea at times. No vomiting.

## 2022-11-06 NOTE — ED Provider Notes (Signed)
[facility]    CSN: [id number] Arrival date & time: 11/06/22  1146      History   Chief Complaint Chief Complaint  Patient presents with   Cough    HPI Cheryl [name] is a 37 y.o. female.   Patient presents urgent care for evaluation of cough, nasal congestion, sore throat, and generalized fatigue for the last 6 days. States her fever at home 2 days ago was 103.5.  Cough is productive with clear phlegm. She states her phlegm became intermittently blood-tinged 2 days ago.  She denies coughing up clots of blood.  Sore throat is currently a 9 on scale 0-10.  Congestion is thick and makes it difficult for her to breathe through her nose.  No longer experiencing fever/chills. Reports nausea without vomiting, abdominal pain, or diarrhea. No urinary symptoms reported. Denies shortness of breath, chest pain, heart palpitations, and extremity weakness.  She is a current everyday cigarette smoker and vapes as well.  Denies other drug use.  Currently takes Suboxone.  History of asthma as a child, no other chronic respiratory problems reported.  No recent antibiotic or steroid use.  Her boyfriend is sick with similar symptoms.  She has been using over-the-counter medications without relief of symptoms.   Cough   Past Medical History:  Diagnosis Date   Abnormal Pap smear    ADHD    Arthritis    bilateral jaws   Asthma    as a child   Bipolar 1 disorder (HCC)    Depression    GERD (gastroesophageal reflux disease)    with pregnancy   Headache    HSV infection    PTSD (post-traumatic stress disorder)    Schizophrenia (HCC)    TMJ (temporomandibular joint disorder)     Patient Active Problem List   Diagnosis Date Noted   S/P cesarean section 10/16/2015   S/P repeat low transverse C-section 04/14/2014    Past Surgical History:  Procedure Laterality Date   CESAREAN SECTION  04/30/2012   Procedure: CESAREAN SECTION;  Surgeon: Antionette Char, MD;  Location: [facility];   Service: Gynecology;  Laterality: N/A;  Primary cesarean section of baby girl at 2113   APGAR  9/9   CESAREAN SECTION N/A 04/14/2014   Procedure: CESAREAN SECTION;  Surgeon: Oliver Pila, MD;  Location: [facility];  Service: Obstetrics;  Laterality: N/A;   CESAREAN SECTION N/A 10/16/2015   Procedure: CESAREAN SECTION;  Surgeon: Sherian Rein, MD;  Location: [facility];  Service: Obstetrics;  Laterality: N/A;   COLPOSCOPY W/ BIOPSY / CURETTAGE     TUBAL LIGATION Bilateral 10/16/2015   Procedure: BILATERAL TUBAL LIGATION;  Surgeon: Sherian Rein, MD;  Location: [facility];  Service: Obstetrics;  Laterality: Bilateral;   WISDOM TOOTH EXTRACTION      OB History     Gravida  3   Para  3   Term  3   Preterm      AB      Living  3      SAB      IAB      Ectopic      Multiple  0   Live Births  3            Home Medications    Prior to Admission medications   Medication Sig Start Date End Date Taking? Authorizing Provider  ARIPiprazole (ABILIFY) 5 MG tablet Take 5 mg by mouth daily. 11/03/22  Yes [provider]  buprenorphine-naloxone (SUBOXONE)  2-0.5 mg SUBL SL tablet Place under the tongue.   Yes [provider]  citalopram (CELEXA) 10 MG tablet Take 10 mg by mouth daily. 11/03/22  Yes [provider]  Citalopram Hydrobromide (CELEXA PO) Take by mouth.   Yes [provider]  fluconazole (DIFLUCAN) 200 MG tablet Take 200 mg by mouth once a week. 11/03/22  Yes [provider]  ibuprofen (ADVIL,MOTRIN) 600 MG tablet Take 1 tablet (600 mg total) by mouth every 6 (six) hours as needed. 04/26/16  Yes Junius Creamer, NP  predniSONE (DELTASONE) 20 MG tablet Take 2 tablets (40 mg total) by mouth daily for 5 days. 11/06/22 11/11/22 Yes Talbot Grumbling, FNP  promethazine-dextromethorphan (PROMETHAZINE-DM) 6.25-15 MG/5ML syrup Take 5 mLs by mouth at bedtime as needed for cough. 11/06/22  Yes Emme Rosenau, Stasia Cavalier, FNP  SUBOXONE 8-2 MG  FILM Place under the tongue 3 (three) times daily. 11/03/22  Yes [provider]  Vitamin D, Ergocalciferol, (DRISDOL) 1.25 MG (50000 UNIT) CAPS capsule Take 50,000 Units by mouth once a week. 05/26/22  Yes [provider]  albuterol (VENTOLIN HFA) 108 (90 Base) MCG/ACT inhaler Inhale 1-2 puffs into the lungs every 6 (six) hours as needed for wheezing or shortness of breath. 11/06/22   Talbot Grumbling, FNP  benzonatate (TESSALON) 100 MG capsule Take 1 capsule (100 mg total) by mouth every 8 (eight) hours as needed for cough. 11/06/22   Talbot Grumbling, FNP  Buprenorphine HCl-Naloxone HCl (SUBOXONE SL) Place under the tongue.    [provider]  calcium carbonate (TUMS - DOSED IN MG ELEMENTAL CALCIUM) 500 MG chewable tablet Chew 2 tablets by mouth daily as needed for indigestion or heartburn.    [provider]  cloNIDine (CATAPRES) 0.1 MG tablet Take 0.1 mg by mouth daily. Patient not taking: Reported on 08/02/2022    [provider]  fluconazole (DIFLUCAN) 150 MG tablet Take 1 tablet (150 mg total) by mouth every 3 (three) days as needed. Can take second dose 3 days after first if symptoms persist. Patient not taking: Reported on 12/09/2021 05/15/21   Raspet, Derry Skill, PA-C  ibuprofen (ADVIL,MOTRIN) 600 MG tablet Take 1 tablet (600 mg total) by mouth every 6 (six) hours as needed for mild pain. 04/26/16   Junius Creamer, NP  LATUDA 40 MG TABS tablet Take 40 mg by mouth daily. 08/05/22   [provider]  Lurasidone HCl (LATUDA PO) Take by mouth.    [provider]  methocarbamol (ROBAXIN) 750 MG tablet Take 1-2 tablets (750-1,500 mg total) by mouth 3 (three) times daily as needed for muscle spasms. 02/28/18   Lorin Glass, PA-C  metroNIDAZOLE (FLAGYL) 500 MG tablet Take 1 tablet (500 mg total) by mouth 2 (two) times daily. Patient not taking: Reported on 08/02/2022 12/10/21   Lamptey, Myrene Galas, MD  NON FORMULARY Antibiotic that she is  taking once a week    [provider]  ondansetron (ZOFRAN ODT) 4 MG disintegrating tablet Take 1 tablet (4 mg total) by mouth every 8 (eight) hours as needed for nausea or vomiting. 04/26/16   Junius Creamer, NP  oxyCODONE-acetaminophen (PERCOCET/ROXICET) 5-325 MG tablet Take 1 tablet by mouth every 4 (four) hours as needed for severe pain. 10/19/15   Sherlyn Hay, DO  Prenatal Vit-Fe Fumarate-FA (PRENATAL MULTIVITAMIN) TABS tablet Take 1 tablet by mouth daily at 12 noon.    [provider]    Family History Family History  Problem Relation Age of  Onset   Anxiety disorder Mother    Depression Mother    Celiac disease Mother    Diabetes Sister    Lupus Sister    Other Neg Hx     Social History Social History   Tobacco Use   Smoking status: Former    Packs/day: 1.00    Years: 3.00    Total pack years: 3.00    Types: Cigarettes    Quit date: 10/30/2010    Years since quitting: 12.0   Smokeless tobacco: Never  Vaping Use   Vaping Use: Some days   Substances: Nicotine, Flavoring  Substance Use Topics   Alcohol use: Not Currently    Comment: Socially    Drug use: Yes    Comment: Rx for Suboxone     Allergies   Patient has no known allergies.   Review of Systems Review of Systems  Respiratory:  Positive for cough.   Per HPI   Physical Exam Triage Vital Signs ED Triage Vitals  Enc Vitals Group     BP 11/06/22 1326 120/73     Pulse Rate 11/06/22 1326 92     Resp 11/06/22 1326 18     Temp 11/06/22 1326 98.3 F (36.8 C)     Temp Source 11/06/22 1326 Oral     SpO2 11/06/22 1326 92 %     Weight 11/06/22 1320 150 lb (68 kg)     Height 11/06/22 1320 5\' 4"  (1.626 m)     Head Circumference --      Peak Flow --      Pain Score 11/06/22 1320 0     Pain Loc --      Pain Edu? --      Excl. in GC? --    No data found.  Updated Vital Signs BP 120/73 (BP Location: Left Arm)   Pulse 92   Temp 98.3 F (36.8 C) (Oral)   Resp 18   Ht 5\' 4"   (1.626 m)   Wt 150 lb (68 kg)   LMP  (LMP Unknown)   SpO2 93%   BMI 25.75 kg/m   Visual Acuity Right Eye Distance:   Left Eye Distance:   Bilateral Distance:    Right Eye Near:   Left Eye Near:    Bilateral Near:     Physical Exam Vitals and nursing note reviewed.  Constitutional:      Appearance: She is not ill-appearing or toxic-appearing.  HENT:     Head: Normocephalic and atraumatic.     Right Ear: Hearing, tympanic membrane, ear canal and external ear normal.     Left Ear: Hearing, tympanic membrane, ear canal and external ear normal.     Nose: Congestion present.     Mouth/Throat:     Lips: Pink.     Mouth: Mucous membranes are moist.     Pharynx: Posterior oropharyngeal erythema present. No oropharyngeal exudate.     Comments: No tonsillar swelling or exudate.  Uvula is mildly swollen and erythematous.  Posterior oropharyngeal erythema present. Eyes:     General: Lids are normal. Vision grossly intact. Gaze aligned appropriately.        Right eye: No discharge.        Left eye: No discharge.     Extraocular Movements: Extraocular movements intact.     Conjunctiva/sclera: Conjunctivae normal.     Pupils: Pupils are equal, round, and reactive to light.  Cardiovascular:     Rate and Rhythm: Normal rate and regular  rhythm.     Heart sounds: Normal heart sounds, S1 normal and S2 normal.  Pulmonary:     Effort: Pulmonary effort is normal. No respiratory distress.     Breath sounds: Normal air entry. Wheezing present.     Comments: Expiratory wheezing present to the upper lung fields.  No respiratory distress or other adventitious lung sounds heard to auscultation. Abdominal:     General: Abdomen is flat. Bowel sounds are normal.     Palpations: Abdomen is soft.     Tenderness: There is no abdominal tenderness. There is no right CVA tenderness or left CVA tenderness.  Musculoskeletal:     Cervical back: Neck supple.     Right lower leg: No edema.     Left lower leg:  No edema.  Lymphadenopathy:     Cervical: Cervical adenopathy present.  Skin:    General: Skin is warm and dry.     Capillary Refill: Capillary refill takes less than 2 seconds.     Findings: No rash.  Neurological:     General: No focal deficit present.     Mental Status: She is alert and oriented to person, place, and time. Mental status is at baseline.     Cranial Nerves: No dysarthria or facial asymmetry.     Motor: No weakness.     Gait: Gait normal.  Psychiatric:        Mood and Affect: Mood normal.        Speech: Speech normal.        Behavior: Behavior normal.        Thought Content: Thought content normal.        Judgment: Judgment normal.      UC Treatments / Results  Labs (all labs ordered are listed, but only abnormal results are displayed) Labs Reviewed  SARS CORONAVIRUS 2 (TAT 6-24 HRS)    EKG   Radiology No results found.  Procedures Procedures (including critical care time)  Medications Ordered in UC Medications  albuterol (VENTOLIN HFA) 108 (90 Base) MCG/ACT inhaler 2 puff (2 puffs Inhalation Given 11/06/22 1418)    Initial Impression / Assessment and Plan / UC Course  I have reviewed the triage vital signs and the nursing notes.  Pertinent labs & imaging results that were available during my care of the patient were reviewed by me and considered in my medical decision making (see chart for details).   1.  Acute bronchitis Presentation is consistent with acute viral bronchitis that will likely improve with use of steroid and cough medications along with rest and increase fluid intake over the next few days.  Deferred imaging based on stable cardiopulmonary exam and hemodynamically stable vital signs. Patient is nontoxic in appearance and is oxygenating well on room air at 96%.  Given albuterol inhaler in clinic for wheeze.  Oral steroid burst, tessalon perles, and promethazine DM sent to pharmacy for symptomatic relief to be taken as prescribed.  Drowsiness precautions regarding promethazine DM discussed, patient voices understanding. No NSAIDs while taking steroid. Advised to push fluids to stay well hydrated.   COVID-19 testing is pending and we will call patient in the next 12 to 24 hours should COVID-19 testing come back positive.  She is currently on day 6 of symptoms and does not qualify for antiviral treatment.  [place] quarantine guidelines.  Discussed physical exam and available lab work findings in clinic with patient.  Counseled patient regarding appropriate use of medications and potential side effects for all medications  recommended or prescribed today. Discussed red flag signs and symptoms of worsening condition,when to call the PCP office, return to urgent care, and when to seek higher level of care in the emergency department. Patient verbalizes understanding and agreement with plan. All questions answered. Patient discharged in stable condition.    Final Clinical Impressions(s) / UC Diagnoses   Final diagnoses:  Acute bronchitis, unspecified organism     Discharge Instructions      You have bronchitis which is inflammation of the upper airways in your lungs due to a virus. The following medicines will help with your symptoms.   - Take steroid sent to pharmacy as directed. Do not take any other NSAID containing medications such as ibuprofen or naproxen/Aleve while taking prednisone. - You may use albuterol inhaler 1 to 2 puffs every 4-6 hours as needed for cough, shortness of breath, and wheezing. - Tessalon perles every 8 hours as needed for cough. - Promethazine DM cough syrup at bedtime as needed.  Do not use this during the day, if you go to work, or drink alcohol when using this as it can make you very sleepy.   If you develop any new or worsening symptoms or do not improve in the next 2 to 3 days, please return.  If your symptoms are severe, please go to the emergency room.  Follow-up with your primary care  provider for further evaluation and management of your symptoms as well as ongoing wellness visits.  I hope you feel better!    ED Prescriptions     Medication Sig Dispense Auth. Provider   albuterol (VENTOLIN HFA) 108 (90 Base) MCG/ACT inhaler Inhale 1-2 puffs into the lungs every 6 (six) hours as needed for wheezing or shortness of breath. 1 each Talbot Grumbling, FNP   benzonatate (TESSALON) 100 MG capsule Take 1 capsule (100 mg total) by mouth every 8 (eight) hours as needed for cough. 21 capsule Talbot Grumbling, FNP   promethazine-dextromethorphan (PROMETHAZINE-DM) 6.25-15 MG/5ML syrup Take 5 mLs by mouth at bedtime as needed for cough. 118 mL Joella Prince M, FNP   predniSONE (DELTASONE) 20 MG tablet Take 2 tablets (40 mg total) by mouth daily for 5 days. 10 tablet Talbot Grumbling, FNP      PDMP not reviewed this encounter.   Talbot Grumbling, [place] 11/06/22 1426

## 2022-11-07 LAB — SARS CORONAVIRUS 2 (TAT 6-24 HRS): SARS Coronavirus 2: NEGATIVE

## 2023-06-02 ENCOUNTER — Ambulatory Visit (INDEPENDENT_AMBULATORY_CARE_PROVIDER_SITE_OTHER): Payer: MEDICAID | Admitting: Family Medicine

## 2023-06-02 ENCOUNTER — Encounter: Payer: Self-pay | Admitting: Family Medicine

## 2023-06-02 VITALS — BP 109/68 | HR 85 | Ht 64.0 in | Wt 166.1 lb

## 2023-06-02 DIAGNOSIS — R7301 Impaired fasting glucose: Secondary | ICD-10-CM | POA: Diagnosis not present

## 2023-06-02 DIAGNOSIS — E559 Vitamin D deficiency, unspecified: Secondary | ICD-10-CM

## 2023-06-02 DIAGNOSIS — F319 Bipolar disorder, unspecified: Secondary | ICD-10-CM | POA: Diagnosis not present

## 2023-06-02 DIAGNOSIS — F1911 Other psychoactive substance abuse, in remission: Secondary | ICD-10-CM

## 2023-06-02 DIAGNOSIS — Z114 Encounter for screening for human immunodeficiency virus [HIV]: Secondary | ICD-10-CM

## 2023-06-02 DIAGNOSIS — Z1159 Encounter for screening for other viral diseases: Secondary | ICD-10-CM

## 2023-06-02 DIAGNOSIS — E7849 Other hyperlipidemia: Secondary | ICD-10-CM

## 2023-06-02 DIAGNOSIS — E038 Other specified hypothyroidism: Secondary | ICD-10-CM

## 2023-06-02 NOTE — Patient Instructions (Addendum)
  I appreciate the opportunity to provide care to you today!    Follow up:  1 month for pap smear  Labs: please stop by the lab today to get your blood drawn (CBC, CMP, TSH, Lipid profile, HgA1c, Vit D)  Screening: HIV and Hep C   Attached with your AVS, you will find valuable resources for self-education. I highly recommend dedicating some time to thoroughly examine them.   Please continue to a heart-healthy diet and increase your physical activities. Try to exercise for at least five days a week.    It was a pleasure to see you and I look forward to continuing to work together on your health and well-being. Please do not hesitate to call the office if you need care or have questions about your care.  In case of emergency, please visit the Emergency Department for urgent care, or contact our clinic at [phone] to schedule an appointment. We're here to help you!   Have a wonderful day and week. With Gratitude, Gilmore Laroche MSN, FNP-BC

## 2023-06-02 NOTE — Progress Notes (Signed)
New Patient Office Visit  Subjective:  Patient ID: Cheryl [name], female    DOB: 11/15/1985  Age: 37 y.o. MRN: [id number]  CC:  Chief Complaint  Patient presents with   Establish Care    New Patient, pt has concerns about bp, states in the past bp has been low, reports taking depakote wants levels checked due to taking this medication.     HPI Cheryl [name] is a 37 y.o. female with past medical history of bipolar and PTSD and is currently following up with her psychiatrist Dr. Cathey Endow presents for establishing care. For the details of today's visit, please refer to the assessment and plan.     Past Medical History:  Diagnosis Date   Abnormal Pap smear    ADHD    Arthritis    bilateral jaws   Asthma    as a child   Bipolar 1 disorder (HCC)    Depression    GERD (gastroesophageal reflux disease)    with pregnancy   Headache    HSV infection    PTSD (post-traumatic stress disorder)    Schizophrenia (HCC)    TMJ (temporomandibular joint disorder)     Past Surgical History:  Procedure Laterality Date   CESAREAN SECTION  04/30/2012   Procedure: CESAREAN SECTION;  Surgeon: Antionette Char, MD;  Location: [facility];  Service: Gynecology;  Laterality: N/A;  Primary cesarean section of baby girl at 2113   APGAR  9/9   CESAREAN SECTION N/A 04/14/2014   Procedure: CESAREAN SECTION;  Surgeon: Oliver Pila, MD;  Location: [facility];  Service: Obstetrics;  Laterality: N/A;   CESAREAN SECTION N/A 10/16/2015   Procedure: CESAREAN SECTION;  Surgeon: Sherian Rein, MD;  Location: [facility];  Service: Obstetrics;  Laterality: N/A;   COLPOSCOPY W/ BIOPSY / CURETTAGE     TUBAL LIGATION Bilateral 10/16/2015   Procedure: BILATERAL TUBAL LIGATION;  Surgeon: Sherian Rein, MD;  Location: [facility];  Service: Obstetrics;  Laterality: Bilateral;   WISDOM TOOTH EXTRACTION      Family History  Problem Relation Age of Onset   Anxiety disorder Mother    Depression Mother    Celiac  disease Mother    Diabetes Sister    Lupus Sister    Other Neg Hx     Social History   Socioeconomic History   Marital status: Single    Spouse name: Not on file   Number of children: Not on file   Years of education: Not on file   Highest education level: Not on file  Occupational History   Not on file  Tobacco Use   Smoking status: Former    Current packs/day: 0.00    Average packs/day: 1 pack/day for 3.0 years (3.0 ttl pk-yrs)    Types: Cigarettes, E-cigarettes    Start date: 10/31/2007    Quit date: 10/30/2010    Years since quitting: 12.6   Smokeless tobacco: Never  Vaping Use   Vaping status: Some Days   Substances: Nicotine, Flavoring  Substance and Sexual Activity   Alcohol use: Yes    Comment: Socially    Drug use: Yes    Comment: Rx for Suboxone   Sexual activity: Yes  Other Topics Concern   Not on file  Social History Narrative   Not on file   Social Determinants of Health   Financial Resource Strain: Not on file  Food Insecurity: Not on file  Transportation Needs: Not on file  Physical Activity: Not on  file  Stress: Not on file  Social Connections: Not on file  Intimate Partner Violence: Not on file    ROS Review of Systems  Constitutional:  Negative for chills and fever.  Eyes:  Negative for visual disturbance.  Respiratory:  Negative for chest tightness and shortness of breath.   Neurological:  Negative for dizziness and headaches.    Objective:   Today's Vitals: BP 109/68   Pulse 85   Ht 5\' 4"  (1.626 m)   Wt 166 lb 1.3 oz (75.3 kg)   SpO2 95%   BMI 28.51 kg/m   Physical Exam HENT:     Head: Normocephalic.     Mouth/Throat:     Mouth: Mucous membranes are moist.  Cardiovascular:     Rate and Rhythm: Normal rate.     Heart sounds: Normal heart sounds.  Pulmonary:     Effort: Pulmonary effort is normal.     Breath sounds: Normal breath sounds.  Neurological:     Mental Status: She is alert.      Assessment & Plan:    Bipolar 1 disorder [facility]) Assessment & Plan: The patient is currently under the care of her psychiatrist, Dr. Cathey Endow, and has been prescribed Depakote and Abilify for the past month. She has requested an assessment of her Depakote levels. I have advised her to follow up with Dr. Cathey Endow for monitoring of Depakote levels. There is low suspicion of toxicity at this time, as the patient does not present with symptoms such as altered mental status, drowsiness, nausea, vomiting, abdominal pain, diarrhea, tremors, or visual disturbances.   History of substance abuse [facility]) Assessment & Plan: The patient, who is a recovering addict currently undergoing Subutex treatment, has reported a history of low blood pressure, with recent readings in the 80s systolic. Although she is asymptomatic in the clinic and denies any symptoms such as dizziness, lightheadedness, or fatigue, her blood pressure was stable during today's visit. I have advised her to increase her fluid intake, maintain a well-balanced diet with at least three meals daily, and monitor her blood pressure regularly at home.   IFG (impaired fasting glucose) -     Hemoglobin A1c  Vitamin D deficiency -     VITAMIN D 25 Hydroxy (Vit-D Deficiency, Fractures)  Need for hepatitis C screening test -     Hepatitis C antibody  Encounter for screening for HIV -     HIV Antibody (routine testing w rflx)  Other specified hypothyroidism -     TSH + free T4  Other hyperlipidemia -     Lipid panel -     CMP14+EGFR -     CBC with Differential/Platelet   Note: This chart has been completed using Engineer, civil (consulting) software, and while attempts have been made to ensure accuracy, certain words and phrases may not be transcribed as intended.    Follow-up: Return in about 1 month (around 07/03/2023) for pap smear.   Gilmore Laroche, FNP

## 2023-06-03 DIAGNOSIS — F1911 Other psychoactive substance abuse, in remission: Secondary | ICD-10-CM | POA: Insufficient documentation

## 2023-06-03 DIAGNOSIS — F319 Bipolar disorder, unspecified: Secondary | ICD-10-CM | POA: Insufficient documentation

## 2023-06-03 NOTE — Assessment & Plan Note (Addendum)
The patient, who is a recovering addict currently undergoing Subutex treatment, has reported a history of low blood pressure, with recent readings in the 80s systolic. Although she is asymptomatic in the clinic and denies any symptoms such as dizziness, lightheadedness, or fatigue, her blood pressure was stable during today's visit. I have advised her to increase her fluid intake, maintain a well-balanced diet with at least three meals daily, and monitor her blood pressure regularly at home.

## 2023-06-03 NOTE — Assessment & Plan Note (Signed)
The patient is currently under the care of her psychiatrist, Dr. Cathey Endow, and has been prescribed Depakote and Abilify for the past month. She has requested an assessment of her Depakote levels. I have advised her to follow up with Dr. Cathey Endow for monitoring of Depakote levels. There is low suspicion of toxicity at this time, as the patient does not present with symptoms such as altered mental status, drowsiness, nausea, vomiting, abdominal pain, diarrhea, tremors, or visual disturbances.

## 2023-06-04 LAB — CMP14+EGFR
ALT: 12 IU/L (ref 0–32)
AST: 15 IU/L (ref 0–40)
Albumin: 4.1 g/dL (ref 3.9–4.9)
Alkaline Phosphatase: 61 IU/L (ref 44–121)
BUN/Creatinine Ratio: 14 (ref 9–23)
BUN: 10 mg/dL (ref 6–20)
Bilirubin Total: 0.4 mg/dL (ref 0.0–1.2)
CO2: 24 mmol/L (ref 20–29)
Calcium: 9.3 mg/dL (ref 8.7–10.2)
Chloride: 102 mmol/L (ref 96–106)
Creatinine, Ser: 0.71 mg/dL (ref 0.57–1.00)
Globulin, Total: 2.7 g/dL (ref 1.5–4.5)
Glucose: 78 mg/dL (ref 70–99)
Potassium: 4 mmol/L (ref 3.5–5.2)
Sodium: 140 mmol/L (ref 134–144)
Total Protein: 6.8 g/dL (ref 6.0–8.5)
eGFR: 114 mL/min/{1.73_m2} (ref >59)

## 2023-06-04 LAB — LIPID PANEL
Chol/HDL Ratio: 3.2 ratio (ref 0.0–4.4)
Cholesterol, Total: 145 mg/dL (ref 100–199)
HDL: 45 mg/dL (ref >39)
LDL Chol Calc (NIH): 83 mg/dL (ref 0–99)
Triglycerides: 88 mg/dL (ref 0–149)
VLDL Cholesterol Cal: 17 mg/dL (ref 5–40)

## 2023-06-04 LAB — CBC WITH DIFFERENTIAL/PLATELET
Basophils Absolute: 0 10*3/uL (ref 0.0–0.2)
Basos: 1 %
EOS (ABSOLUTE): 0.1 10*3/uL (ref 0.0–0.4)
Eos: 2 %
Hematocrit: 36 % (ref 34.0–46.6)
Hemoglobin: 11.9 g/dL (ref 11.1–15.9)
Immature Grans (Abs): 0 10*3/uL (ref 0.0–0.1)
Immature Granulocytes: 0 %
Lymphocytes Absolute: 1.7 10*3/uL (ref 0.7–3.1)
Lymphs: 26 %
MCH: 28.5 pg (ref 26.6–33.0)
MCHC: 33.1 g/dL (ref 31.5–35.7)
MCV: 86 fL (ref 79–97)
Monocytes Absolute: 0.6 10*3/uL (ref 0.1–0.9)
Monocytes: 10 %
Neutrophils Absolute: 4.1 10*3/uL (ref 1.4–7.0)
Neutrophils: 61 %
Platelets: 216 10*3/uL (ref 150–450)
RBC: 4.18 x10E6/uL (ref 3.77–5.28)
RDW: 12.4 % (ref 11.7–15.4)
WBC: 6.5 10*3/uL (ref 3.4–10.8)

## 2023-06-04 LAB — HEMOGLOBIN A1C
Est. average glucose Bld gHb Est-mCnc: 100 mg/dL
Hgb A1c MFr Bld: 5.1 % (ref 4.8–5.6)

## 2023-06-04 LAB — HIV ANTIBODY (ROUTINE TESTING W REFLEX): HIV Screen 4th Generation wRfx: NONREACTIVE

## 2023-06-04 LAB — TSH+FREE T4
Free T4: 1.14 ng/dL (ref 0.82–1.77)
TSH: 2.18 u[IU]/mL (ref 0.450–4.500)

## 2023-06-04 LAB — HEPATITIS C ANTIBODY: Hep C Virus Ab: REACTIVE — AB

## 2023-06-04 LAB — VITAMIN D 25 HYDROXY (VIT D DEFICIENCY, FRACTURES): Vit D, 25-Hydroxy: 27.4 ng/mL — ABNORMAL LOW (ref 30.0–100.0)

## 2023-06-05 ENCOUNTER — Other Ambulatory Visit: Payer: Self-pay | Admitting: Family Medicine

## 2023-06-05 DIAGNOSIS — E559 Vitamin D deficiency, unspecified: Secondary | ICD-10-CM

## 2023-06-05 DIAGNOSIS — R768 Other specified abnormal immunological findings in serum: Secondary | ICD-10-CM

## 2023-06-05 MED ORDER — VITAMIN D (ERGOCALCIFEROL) 1.25 MG (50000 UNIT) PO CAPS: 50000.0000 [IU] | ORAL_CAPSULE | ORAL | 1 refills | Status: AC

## 2023-07-06 ENCOUNTER — Ambulatory Visit: Payer: MEDICAID | Admitting: Family Medicine

## 2023-07-11 ENCOUNTER — Encounter: Payer: Self-pay | Admitting: General Practice

## 2024-07-21 ENCOUNTER — Other Ambulatory Visit: Payer: Self-pay

## 2024-07-21 ENCOUNTER — Emergency Department (HOSPITAL_BASED_OUTPATIENT_CLINIC_OR_DEPARTMENT_OTHER): Admission: EM | Admit: 2024-07-21 | Discharge: 2024-07-22 | Disposition: A | Payer: Self-pay

## 2024-07-21 ENCOUNTER — Emergency Department (HOSPITAL_BASED_OUTPATIENT_CLINIC_OR_DEPARTMENT_OTHER): Payer: Self-pay

## 2024-07-21 ENCOUNTER — Encounter (HOSPITAL_BASED_OUTPATIENT_CLINIC_OR_DEPARTMENT_OTHER): Payer: Self-pay | Admitting: Emergency Medicine

## 2024-07-21 DIAGNOSIS — M26629 Arthralgia of temporomandibular joint, unspecified side: Secondary | ICD-10-CM | POA: Insufficient documentation

## 2024-07-21 MED ORDER — METHOCARBAMOL 500 MG PO TABS
1000.0000 mg | ORAL_TABLET | Freq: Once | ORAL | Status: AC
  Administered 2024-07-22: 1000 mg via ORAL
  Filled 2024-07-21: qty 2

## 2024-07-21 MED ORDER — KETOROLAC TROMETHAMINE 15 MG/ML IJ SOLN
15.0000 mg | Freq: Once | INTRAMUSCULAR | Status: AC
  Administered 2024-07-22: 15 mg via INTRAMUSCULAR
  Filled 2024-07-21: qty 1

## 2024-07-21 MED ORDER — PREDNISONE 20 MG PO TABS
40.0000 mg | ORAL_TABLET | Freq: Once | ORAL | Status: AC
  Administered 2024-07-22: 40 mg via ORAL
  Filled 2024-07-21: qty 2

## 2024-07-21 MED ORDER — METHOCARBAMOL 500 MG PO TABS: 1000.0000 mg | ORAL_TABLET | Freq: Four times a day (QID) | ORAL | 0 refills | Status: AC | PRN

## 2024-07-21 NOTE — ED Triage Notes (Addendum)
 Pt via POV c/o right jaw pain, chronic, from TMJ since 2010 but much worse over the past week. Pt also has increased saliva production and a lot of pressure in her right ear; feels like she has to depressurize her ear more often than normal. Pt requests non-narcotic meds to support recovery.

## 2024-07-21 NOTE — Discharge Instructions (Signed)
 You can continue to alternate Tylenol  and Motrin .  Alternate them every 3 hours as needed for pain.  You can take your Robaxin  up to 4 times a day as needed for pain and spasm.  Take your steroids as prescribed.

## 2024-07-22 NOTE — ED Provider Notes (Signed)
 [place] EMERGENCY DEPARTMENT AT [facility] Provider Note   CSN: [id number] Arrival date & time: 07/21/24  8048     Patient presents with: Jaw Pain   Cheryl [name] is a 38 y.o. female.   38 year old female presents for evaluation of right jaw pain.  She has a history of TMJ and states this feels worse than her usual pain.  States her ear has been popping as well.  Patient denies symptoms or concerns.        Prior to Admission medications   Medication Sig Start Date End Date Taking? Authorizing Provider  methocarbamol  (ROBAXIN ) 500 MG tablet Take 2 tablets (1,000 mg total) by mouth every 6 (six) hours as needed for up to 7 days for muscle spasms. 07/21/24 07/28/24 Yes Syla Devoss L, DO  predniSONE  (STERAPRED UNI-PAK 21 TAB) 10 MG (21) TBPK tablet Take by mouth daily. Take 6 tabs by mouth daily  for 2 days, then 5 tabs for 2 days, then 4 tabs for 2 days, then 3 tabs for 2 days, 2 tabs for 2 days, then 1 tab by mouth daily for 2 days 07/21/24  Yes Mishti Swanton L, DO  albuterol  (VENTOLIN  HFA) 108 (90 Base) MCG/ACT inhaler Inhale 1-2 puffs into the lungs every 6 (six) hours as needed for wheezing or shortness of breath. 11/06/22   Enedelia Dorna HERO, FNP  ARIPiprazole (ABILIFY) 5 MG tablet Take 5 mg by mouth daily. 11/03/22   [provider]  citalopram (CELEXA) 10 MG tablet Take 10 mg by mouth daily. 11/03/22   [provider]  divalproex (DEPAKOTE) 500 MG DR tablet Take 500 mg by mouth daily. 06/01/23   [provider]  hydrOXYzine (VISTARIL) 50 MG capsule Take 50 mg by mouth 3 (three) times daily as needed. 04/26/23   [provider]  SUBOXONE 8-2 MG FILM Place under the tongue 3 (three) times daily. 11/03/22   [provider]  Vitamin D , Ergocalciferol , (DRISDOL ) 1.25 MG (50000 UNIT) CAPS capsule Take 1 capsule (50,000 Units total) by mouth every 7 (seven) days. 06/05/23   Bacchus, Meade PEDLAR, FNP    Allergies: Patient has no  known allergies.    Review of Systems  Constitutional:  Negative for chills and fever.  HENT:  Positive for ear pain. Negative for sore throat.        Admits right TMJ pain  Eyes:  Negative for pain and visual disturbance.  Respiratory:  Negative for cough and shortness of breath.   Cardiovascular:  Negative for chest pain and palpitations.  Gastrointestinal:  Negative for abdominal pain and vomiting.  Genitourinary:  Negative for dysuria and hematuria.  Musculoskeletal:  Negative for arthralgias and back pain.  Skin:  Negative for color change and rash.  Neurological:  Negative for seizures and syncope.  All other systems reviewed and are negative.   Updated Vital Signs BP 114/82 (BP Location: Right Arm)   Pulse 78   Temp 98.4 F (36.9 C) (Oral)   Resp 16   Ht 5' 4 (1.626 m)   Wt 81.6 kg   LMP 07/21/2024 (Exact Date)   SpO2 98%   Breastfeeding No   BMI 30.90 kg/m   Physical Exam Vitals and nursing note reviewed.  Constitutional:      General: She is not in acute distress.    Appearance: She is well-developed.  HENT:     Head: Normocephalic and atraumatic.     Right Ear: Tympanic membrane normal.  Left Ear: Tympanic membrane normal.     Mouth/Throat:     Comments: Patient does have right-sided TMJ tenderness to palpation as well as crepitus upon opening closing of the jaw Eyes:     Conjunctiva/sclera: Conjunctivae normal.  Cardiovascular:     Rate and Rhythm: Normal rate and regular rhythm.     Heart sounds: No murmur heard. Pulmonary:     Effort: Pulmonary effort is normal. No respiratory distress.     Breath sounds: Normal breath sounds.  Abdominal:     Palpations: Abdomen is soft.     Tenderness: There is no abdominal tenderness.  Musculoskeletal:        General: No swelling.     Cervical back: Neck supple.  Skin:    General: Skin is warm and dry.     Capillary Refill: Capillary refill takes less than 2 seconds.  Neurological:     Mental Status: She  is alert.  Psychiatric:        Mood and Affect: Mood normal.     (all labs ordered are listed, but only abnormal results are displayed) Labs Reviewed - No data to display  EKG: None  Radiology: CT Maxillofacial Wo Contrast Result Date: 07/21/2024 EXAM: CT OF THE FACE WITHOUT CONTRAST 07/21/2024 08:37:57 PM TECHNIQUE: CT of the face was performed without the administration of intravenous contrast. Multiplanar reformatted images are provided for review. Automated exposure control, iterative reconstruction, and/or weight based adjustment of the mA/kV was utilized to reduce the radiation dose to as low as reasonably achievable. COMPARISON: CT head 04/26/2016. CLINICAL HISTORY: TMJ pain or limited movement. c/o right jaw pain, chronic, from TMJ since 2010 but much worse over the past week. Pt also has increased saliva production and a lot of pressure in her right ear; feels like she has to depressurize her ear more often than normal. FINDINGS: FACIAL BONES: Right temporomandibular joint degenerative changes (9:64). No acute facial fracture. No mandibular dislocation. No suspicious bone lesion. ORBITS: Globes are intact. No acute traumatic injury. No inflammatory change. SINUSES AND MASTOIDS: No acute abnormality. SOFT TISSUES: No acute abnormality. IMPRESSION: 1. Right temporomandibular joint degenerative changes. Electronically signed by: Morgane Naveau MD 07/21/2024 08:46 PM EDT RP Workstation: [id number]     Procedures   Medications Ordered in the ED  ketorolac  (TORADOL ) 15 MG/ML injection 15 mg (15 mg Intramuscular Given 07/22/24 0013)  methocarbamol  (ROBAXIN ) tablet 1,000 mg (1,000 mg Oral Given 07/22/24 0013)  predniSONE  (DELTASONE ) tablet 40 mg (40 mg Oral Given 07/22/24 0013)                                    Medical Decision Making Social determinants of health: Patient does not currently have any health insurance or primary care doctor  Patient here for TMJ pain.  She has a  history of this at baseline but seems worse today.  Will have her Toradol  here.  Will give her prescription for Robaxin  to use as needed as well as some steroids.  Advised to continue Tylenol  Motrin  as needed for pain.  CT scan just shows degenerative changes of the TM J region.  Advise follow-up with ENT and primary care.  Advised to return to the ER for new or worsening symptoms.  She otherwise appears well with stable vital signs.  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    Details: Prior outpatient records reviewed patient was seen in urgent care in 11/23/2022 for viral URI Radiology: ordered and independent interpretation performed. Decision-making details documented in ED Course.    Details: Ordered and reviewed by me CT maxillofacial: Shows evidence of right sided degenerative TMJ changes but no other acute abnormality  Risk OTC drugs. Prescription drug management. Diagnosis or treatment significantly limited by social determinants of health.     Final diagnoses:  TMJ pain dysfunction syndrome    ED Discharge Orders          Ordered    methocarbamol  (ROBAXIN ) 500 MG tablet  Every 6 hours PRN        07/21/24 2343    predniSONE  (STERAPRED UNI-PAK 21 TAB) 10 MG (21) TBPK tablet  Daily        07/21/24 2343               Gennaro Bouchard L, DO 07/22/24 [id number]

## 2024-09-01 ENCOUNTER — Emergency Department
Admission: EM | Admit: 2024-09-01 | Discharge: 2024-09-02 | Disposition: A | Payer: Self-pay | Attending: Emergency Medicine | Admitting: Emergency Medicine

## 2024-09-01 ENCOUNTER — Other Ambulatory Visit: Payer: Self-pay

## 2024-09-01 DIAGNOSIS — Y93G1 Activity, food preparation and clean up: Secondary | ICD-10-CM | POA: Insufficient documentation

## 2024-09-01 DIAGNOSIS — S61412A Laceration without foreign body of left hand, initial encounter: Secondary | ICD-10-CM | POA: Insufficient documentation

## 2024-09-01 DIAGNOSIS — W260XXA Contact with knife, initial encounter: Secondary | ICD-10-CM | POA: Insufficient documentation

## 2024-09-01 DIAGNOSIS — Z23 Encounter for immunization: Secondary | ICD-10-CM | POA: Insufficient documentation

## 2024-09-01 DIAGNOSIS — J45909 Unspecified asthma, uncomplicated: Secondary | ICD-10-CM | POA: Insufficient documentation

## 2024-09-01 NOTE — ED Triage Notes (Signed)
 Pt presents via POV c/o laceration to left hand tonight.

## 2024-09-02 MED ORDER — BACITRACIN ZINC 500 UNIT/GM EX OINT
TOPICAL_OINTMENT | Freq: Once | CUTANEOUS | Status: DC
  Filled 2024-09-02: qty 0.9

## 2024-09-02 MED ORDER — TETANUS-DIPHTH-ACELL PERTUSSIS 5-2-15.5 LF-MCG/0.5 IM SUSP
0.5000 mL | Freq: Once | INTRAMUSCULAR | Status: AC
  Administered 2024-09-02: 0.5 mL via INTRAMUSCULAR
  Filled 2024-09-02: qty 0.5

## 2024-09-02 MED ORDER — DOXYCYCLINE HYCLATE 100 MG PO TABS: 100.0000 mg | ORAL_TABLET | Freq: Two times a day (BID) | ORAL | 0 refills | Status: AC

## 2024-09-02 MED ORDER — LIDOCAINE HCL (PF) 1 % IJ SOLN
5.0000 mL | Freq: Once | INTRAMUSCULAR | Status: DC
  Filled 2024-09-02: qty 5

## 2024-09-02 NOTE — ED Notes (Signed)
 Bacitracin  applied and wound wrapped with Tefla dressing and Kerlex. Pt educated on stitches and wound care. Verbalized understanding and states no further needs at this time.

## 2024-09-02 NOTE — ED Provider Notes (Signed)
 [facility] Provider Note   Event Date/Time   First MD Initiated Contact with Patient 09/02/24 0010     (approximate) History  Laceration  HPI Cheryl [name] is a 38 y.o. female with a past medical history of bipolar disorder, anxiety/depression, schizophrenia, and asthma who presents after a laceration to the left hand that occurred approximately 12 hours prior to arrival.  Patient states that she was cutting vegetables for a dinner with a kitchen knife when it slipped and cut the palmar surface of the first MTP joint.  Patient states that the bleeding was controlled soon after and therefore she did not present to the emergency department immediately.  Patient does not recall when her last tetanus shot was ROS: Patient currently denies any vision changes, tinnitus, difficulty speaking, facial droop, sore throat, chest pain, shortness of breath, abdominal pain, nausea/vomiting/diarrhea, dysuria, or weakness/numbness/paresthesias in any extremity   Physical Exam  Triage Vital Signs: ED Triage Vitals  Encounter Vitals Group     BP 09/01/24 2256 124/70     Girls Systolic BP Percentile --      Girls Diastolic BP Percentile --      Boys Systolic BP Percentile --      Boys Diastolic BP Percentile --      Pulse Rate 09/01/24 2256 90     Resp 09/01/24 2256 18     Temp 09/01/24 2256 98.4 F (36.9 C)     Temp Source 09/01/24 2256 Oral     SpO2 09/01/24 2256 99 %     Weight 09/01/24 2256 190 lb (86.2 kg)     Height 09/01/24 2256 5' 4 (1.626 m)     Head Circumference --      Peak Flow --      Pain Score 09/01/24 2310 7     Pain Loc --      Pain Education --      Exclude from Growth Chart --    Most recent vital signs: Vitals:   09/01/24 2256  BP: 124/70  Pulse: 90  Resp: 18  Temp: 98.4 F (36.9 C)  SpO2: 99%   General: Awake, oriented x4. CV:  Good peripheral perfusion. Resp:  Normal effort. Abd:  No distention. Other:  Middle-aged overweight Caucasian  female resting comfortably in no acute distress.  1.5 cm curvilinear laceration to the palmar aspect of the first MTP joint of the left hand that is hemostatic and not grossly contaminated ED Results / Procedures / Treatments  PROCEDURES: Critical Care performed: No .Laceration Repair  Date/Time: 09/02/2024 1:11 AM  Performed by: Jossie Artist POUR, MD Authorized by: Jossie Artist POUR, MD   Consent:    Consent obtained:  Verbal   Consent given by:  Patient   Risks, benefits, and alternatives were discussed: yes     Risks discussed:  Infection, pain, retained foreign body, need for additional repair, poor cosmetic result, tendon damage, vascular damage, poor wound healing and nerve damage   Alternatives discussed:  No treatment, delayed treatment, observation and referral Universal protocol:    Immediately prior to procedure, a time out was called: yes     Patient identity confirmed:  Verbally with patient Anesthesia:    Anesthesia method:  Local infiltration Laceration details:    Location:  Hand   Hand location:  L palm   Length (cm):  1.5   Depth (mm):  8 Pre-procedure details:    Preparation:  Patient was prepped and draped in usual sterile  fashion Exploration:    Wound exploration: entire depth of wound visualized     Contaminated: no   Treatment:    Area cleansed with:  Povidone-iodine and saline   Amount of cleaning:  Standard   Irrigation solution:  Sterile saline   Irrigation method:  Syringe Skin repair:    Repair method:  Sutures   Suture size:  4-0   Suture material:  Nylon   Suture technique:  Simple interrupted   Number of sutures:  6 Approximation:    Approximation:  Close Repair type:    Repair type:  Simple Post-procedure details:    Dressing:  Antibiotic ointment and non-adherent dressing   Procedure completion:  Tolerated well, no immediate complications  MEDICATIONS ORDERED IN ED: Medications  lidocaine  (PF) (XYLOCAINE ) 1 % injection 5 mL (has no  administration in time range)  bacitracin  ointment (has no administration in time range)  Tdap (ADACEL ) injection 0.5 mL (0.5 mLs Intramuscular Given 09/02/24 0026)   IMPRESSION / MDM / ASSESSMENT AND PLAN / ED COURSE  I reviewed the triage vital signs and the nursing notes.                             The patient is on the cardiac monitor to evaluate for evidence of arrhythmia and/or significant heart rate changes. Patient's presentation is most consistent with acute presentation with potential threat to life or bodily function. Patient had a laceration that was repaired in the ED after copious irrigation.  Please see laceration procedure note for further details.  After exploration of the wound, there was no evidence of a retained foreign body. No evidence of underlying fracture. TDAP: Updated Interventions: Defer ABX at this time given location, event time, and patient without surrounding signs of infection. Disposition: Discharge. Patient has been given strict wound return precautions and instructions to follow up with their PMD in 2 days for a wound recheck.   FINAL CLINICAL IMPRESSION(S) / ED DIAGNOSES   Final diagnoses:  Laceration of left palm without complication, initial encounter   Rx / DC Orders   ED Discharge Orders     None      Note:  This document was prepared using Dragon voice recognition software and may include unintentional dictation errors.   Angela Vazguez K, MD 09/02/24 0111
